# Patient Record
Sex: Male | Born: 1955 | Race: Black or African American | Hispanic: No | Marital: Married | Smoking: Never smoker
Health system: Southern US, Community
[De-identification: ages and names within clinical notes are randomized; demographics above are authoritative.]

## PROBLEM LIST (undated history)

## (undated) DIAGNOSIS — E785 Hyperlipidemia, unspecified: Secondary | ICD-10-CM

## (undated) DIAGNOSIS — R011 Cardiac murmur, unspecified: Secondary | ICD-10-CM

## (undated) DIAGNOSIS — J45909 Unspecified asthma, uncomplicated: Secondary | ICD-10-CM

## (undated) DIAGNOSIS — I10 Essential (primary) hypertension: Secondary | ICD-10-CM

## (undated) DIAGNOSIS — N189 Chronic kidney disease, unspecified: Secondary | ICD-10-CM

## (undated) DIAGNOSIS — R0981 Nasal congestion: Secondary | ICD-10-CM

## (undated) DIAGNOSIS — K648 Other hemorrhoids: Secondary | ICD-10-CM

## (undated) DIAGNOSIS — M1990 Unspecified osteoarthritis, unspecified site: Secondary | ICD-10-CM

## (undated) DIAGNOSIS — T7840XA Allergy, unspecified, initial encounter: Secondary | ICD-10-CM

## (undated) HISTORY — DX: Nasal congestion: R09.81

## (undated) HISTORY — PX: HAND TENDON SURGERY: SHX663

## (undated) HISTORY — DX: Unspecified asthma, uncomplicated: J45.909

## (undated) HISTORY — DX: Allergy, unspecified, initial encounter: T78.40XA

## (undated) HISTORY — DX: Unspecified osteoarthritis, unspecified site: M19.90

## (undated) HISTORY — PX: ELBOW SURGERY: SHX618

## (undated) HISTORY — DX: Hyperlipidemia, unspecified: E78.5

## (undated) HISTORY — PX: POLYPECTOMY: SHX149

## (undated) HISTORY — DX: Essential (primary) hypertension: I10

## (undated) HISTORY — PX: COLONOSCOPY: SHX174

## (undated) HISTORY — DX: Cardiac murmur, unspecified: R01.1

## (undated) HISTORY — DX: Other hemorrhoids: K64.8

## (undated) HISTORY — DX: Chronic kidney disease, unspecified: N18.9

---

## 2005-05-04 ENCOUNTER — Ambulatory Visit: Payer: Self-pay | Admitting: Gastroenterology

## 2005-05-22 ENCOUNTER — Encounter (INDEPENDENT_AMBULATORY_CARE_PROVIDER_SITE_OTHER): Payer: Self-pay | Admitting: *Deleted

## 2005-05-22 ENCOUNTER — Ambulatory Visit: Payer: Self-pay | Admitting: Gastroenterology

## 2005-06-20 ENCOUNTER — Ambulatory Visit: Payer: Self-pay | Admitting: Gastroenterology

## 2007-01-20 ENCOUNTER — Ambulatory Visit (HOSPITAL_COMMUNITY): Admission: RE | Admit: 2007-01-20 | Discharge: 2007-01-20 | Payer: Self-pay | Admitting: Urology

## 2007-08-11 ENCOUNTER — Ambulatory Visit: Payer: Self-pay | Admitting: Gastroenterology

## 2007-08-11 DIAGNOSIS — R195 Other fecal abnormalities: Secondary | ICD-10-CM

## 2007-09-03 ENCOUNTER — Ambulatory Visit: Payer: Self-pay | Admitting: Gastroenterology

## 2007-09-12 ENCOUNTER — Ambulatory Visit: Payer: Self-pay | Admitting: Gastroenterology

## 2007-09-12 ENCOUNTER — Encounter: Payer: Self-pay | Admitting: Gastroenterology

## 2007-09-17 ENCOUNTER — Encounter: Payer: Self-pay | Admitting: Gastroenterology

## 2007-09-30 ENCOUNTER — Ambulatory Visit: Payer: Self-pay | Admitting: Gastroenterology

## 2011-11-30 ENCOUNTER — Ambulatory Visit: Payer: 59

## 2011-11-30 ENCOUNTER — Ambulatory Visit (INDEPENDENT_AMBULATORY_CARE_PROVIDER_SITE_OTHER): Payer: 59 | Admitting: Family Medicine

## 2011-11-30 ENCOUNTER — Encounter: Payer: Self-pay | Admitting: Family Medicine

## 2011-11-30 VITALS — BP 135/87 | HR 78 | Temp 98.0°F | Resp 18 | Ht 66.0 in | Wt 188.0 lb

## 2011-11-30 DIAGNOSIS — Z23 Encounter for immunization: Secondary | ICD-10-CM

## 2011-11-30 DIAGNOSIS — Z0000 Encounter for general adult medical examination without abnormal findings: Secondary | ICD-10-CM

## 2011-11-30 DIAGNOSIS — M542 Cervicalgia: Secondary | ICD-10-CM

## 2011-11-30 DIAGNOSIS — E785 Hyperlipidemia, unspecified: Secondary | ICD-10-CM

## 2011-11-30 DIAGNOSIS — R945 Abnormal results of liver function studies: Secondary | ICD-10-CM

## 2011-11-30 DIAGNOSIS — Z8042 Family history of malignant neoplasm of prostate: Secondary | ICD-10-CM

## 2011-11-30 DIAGNOSIS — R7989 Other specified abnormal findings of blood chemistry: Secondary | ICD-10-CM

## 2011-11-30 LAB — POCT URINALYSIS DIPSTICK
Leukocytes, UA: NEGATIVE
Protein, UA: NEGATIVE
Spec Grav, UA: 1.02
Urobilinogen, UA: 0.2

## 2011-11-30 LAB — COMPREHENSIVE METABOLIC PANEL
ALT: 45 U/L (ref 0–53)
CO2: 28 mEq/L (ref 19–32)
Creat: 1.02 mg/dL (ref 0.50–1.35)
Total Bilirubin: 0.8 mg/dL (ref 0.3–1.2)

## 2011-11-30 LAB — IFOBT (OCCULT BLOOD): IFOBT: NEGATIVE

## 2011-11-30 LAB — LIPID PANEL
Cholesterol: 182 mg/dL (ref 0–200)
HDL: 52 mg/dL (ref >39)
Total CHOL/HDL Ratio: 3.5 Ratio
VLDL: 36 mg/dL (ref 0–40)

## 2011-11-30 MED ORDER — SIMVASTATIN 20 MG PO TABS: 20.0000 mg | ORAL_TABLET | Freq: Every evening | ORAL | Status: DC

## 2011-11-30 NOTE — Progress Notes (Signed)
Subjective:    Patient ID: Cory Baker, male    DOB: [birth year], 56 y.o.   MRN: [id number]  HPI   This 56 y.o. AA male is here for CPE; he takes Simvastatin 20 mg for lipid disorder. He reports  no side effects with this medication. He does have some mild neck discomfort with "popping and   cracking in back of neck" with some head movements (no radicular symptoms).   HE is married and works as a Biochemist, clinical; he stays physically active with strength training  twice a week. He denies any recent injury.   HCM: CRS- 2010 (normal)    Review of Systems  HENT: Positive for neck pain and sinus pressure.   All other systems reviewed and are negative.       Objective:   Physical Exam  Nursing note and vitals reviewed. Constitutional: He is oriented to person, place, and time. He appears well-developed and well-nourished. No distress.  HENT:  Head: Normocephalic and atraumatic.  Right Ear: Hearing, tympanic membrane, external ear and ear canal normal.  Left Ear: Hearing, tympanic membrane, external ear and ear canal normal.  Nose: No mucosal edema, rhinorrhea, nasal deformity or septal deviation. Right sinus exhibits no maxillary sinus tenderness and no frontal sinus tenderness. Left sinus exhibits no maxillary sinus tenderness and no frontal sinus tenderness.  Mouth/Throat: Uvula is midline, oropharynx is clear and moist and mucous membranes are normal. No oral lesions. Normal dentition. No dental caries.  Eyes: Conjunctivae normal, EOM and lids are normal. Pupils are equal, round, and reactive to light. No scleral icterus.  Fundoscopic exam:      The right eye shows no arteriolar narrowing, no AV nicking and no papilledema. The right eye shows red reflex.      The left eye shows no arteriolar narrowing, no AV nicking and no papilledema. The left eye shows red reflex. Neck: Normal range of motion. Neck supple. No thyromegaly present.       Posterior neck area w/o point  tenderness; ROM is normal- rotation, flexion and extension.  Cardiovascular: Normal rate, regular rhythm, normal heart sounds and intact distal pulses.  Exam reveals no gallop and no friction rub.   No murmur heard. Pulmonary/Chest: Effort normal and breath sounds normal. No respiratory distress.  Abdominal: Soft. Bowel sounds are normal. He exhibits no distension, no abdominal bruit, no pulsatile midline mass and no mass. There is no hepatosplenomegaly. There is no tenderness. There is no guarding and no CVA tenderness. No hernia. Hernia confirmed negative in the right inguinal area and confirmed negative in the left inguinal area.  Genitourinary: Rectum normal, prostate normal, testes normal and penis normal. Rectal exam shows no external hemorrhoid, no fissure, no mass, no tenderness and anal tone normal. Guaiac negative stool. Prostate is not tender. Cremasteric reflex is present. Right testis shows no mass, no swelling and no tenderness. Left testis shows no mass, no swelling and no tenderness.  Musculoskeletal: Normal range of motion. He exhibits no edema and no tenderness.  Lymphadenopathy:    He has no cervical adenopathy.       Right: No inguinal adenopathy present.       Left: No inguinal adenopathy present.  Neurological: He is alert and oriented to person, place, and time. He has normal reflexes. No cranial nerve deficit. He exhibits normal muscle tone. Coordination normal.  Skin: Skin is warm.  Psychiatric: He has a normal mood and affect. His behavior is normal. Judgment and thought content normal.  [facility] reading (PRIMARY) by  Dr. Audria Nine: C- spine- mild deg changes at C5-6 and C6-7. No fracture or other deformity.       Assessment & Plan:   1. Routine general medical examination at a health care facility  POCT urinalysis dipstick, IFOBT POC (occult bld, rslt in office)  2. Hyperlipidemia LDL goal < 100  Lipid panel  3. Abnormal LFTs - Nov 2012: AST=44, ALT=64  Comprehensive metabolic panel  4. Family hx of prostate cancer - father died at 61 PSA  5. Neck pain  OTC NSAIDs and topical analgesic/ moist heat NECK HEALTH pamphlet given  6. Need for prophylactic vaccination with combined diphtheria-tetanus-pertussis (DTP) vaccine  Tdap vaccine greater than or equal to 7yo IM

## 2011-11-30 NOTE — Patient Instructions (Addendum)
Keeping you healthy  Get these tests  Blood pressure- Have your blood pressure checked once a year by your healthcare provider.  Normal blood pressure is 120/80  Weight- Have your body mass index (BMI) calculated to screen for obesity.  BMI is a measure of body fat based on height and weight. You can also calculate your own BMI at [email or web address].  Cholesterol- Have your cholesterol checked every year.  Diabetes- Have your blood sugar checked regularly if you have high blood pressure, high cholesterol, have a family history of diabetes or if you are overweight.  Screening for Colon Cancer- Colonoscopy starting at age 11.  Screening may begin sooner depending on your family history and other health conditions. Follow up colonoscopy as directed by your Gastroenterologist.  Screening for Prostate Cancer- Both blood work (PSA) and a rectal exam help screen for Prostate Cancer.  Screening begins at age 36 with African-American men and at age 65 with Caucasian men.  Screening may begin sooner depending on your family history.  Take these medicines  Aspirin- One aspirin daily can help prevent Heart disease and Stroke.  Flu shot- Every fall.  Tetanus- Every 10 years. You had Tdap today; next Tetanus due in 2023.  Zostavax- Once after the age of 54 to prevent Shingles.  Pneumonia shot- Once after the age of 38; if you are younger than 70, ask your healthcare provider if you need a Pneumonia shot.  Take these steps  Don't smoke- If you do smoke, talk to your doctor about quitting.  For tips on how to quit, go to www.smokefree.gov or call 1-800-QUIT-NOW.  Be physically active- Exercise 5 days a week for at least 30 minutes.  If you are not already physically active start slow and gradually work up to 30 minutes of moderate physical activity.  Examples of moderate activity include walking briskly, mowing the yard, dancing, swimming, bicycling, etc.  Eat a healthy diet- Eat a variety of  healthy food such as fruits, vegetables, low fat milk, low fat cheese, yogurt, lean meant, poultry, fish, beans, tofu, etc. For more information go to www.thenutritionsource.org  Drink alcohol in moderation- Limit alcohol intake to less than two drinks a day. Never drink and drive.  Dentist- Brush and floss twice daily; visit your dentist twice a year.  Depression- Your emotional health is as important as your physical health. If you're feeling down, or losing interest in things you would normally enjoy please talk to your healthcare provider.  Eye exam- Visit your eye doctor every year.  Safe sex- If you may be exposed to a sexually transmitted infection, use a condom.  Seat belts- Seat belts can save your life; always wear one.  Smoke/Carbon Monoxide detectors- These detectors need to be installed on the appropriate level of your home.  Replace batteries at least once a year.  Skin cancer- When out in the sun, cover up and use sunscreen 15 SPF or higher.  Violence- If anyone is threatening you, please tell your healthcare provider.  Living Will/ Health care power of attorney- Speak with your healthcare provider and family.    Testicular Problems and Self-Exam Men can examine themselves easily and effectively with positive results. Monthly exams detect problems early and save lives. There are numerous causes of swelling in the testicle. Testicular cancer usually appears as a firm painless lump in the front part of the testicle. This may feel like a dull ache or heavy feeling located in the lower abdomen (belly), groin, or scrotum.  The risk is greater in men with undescended testicles and it is more common in young men. It is responsible for almost a fifth of cancers in males between ages 46 and 64. Other common causes of swellings, lumps, and testicular pain include injuries, inflammation (soreness) from infection, hydrocele, and torsion. These are a few of the reasons to do monthly  self-examination of the testicles. The exam only takes minutes and could add years to your life. Get in the habit! SELF-EXAMINATION OF THE TESTICLES The testicles are easiest to examine after warm baths or showers and are more difficult to examine when you are cold. This is because the muscles attached to the testicles retract and pull them up higher or into the abdomen. While standing, roll one testicle between the thumb and forefinger. Feel for lumps, swelling, or discomfort. A normal testicle is egg shaped and feels firm. It is smooth and not tender. The spermatic cord can be felt as a firm spaghetti-like cord at the back of the testicle. It is also important to examine your groins. This is the crease between the front of your leg and your abdomen. Also, feel for enlarged lymph nodes (glands). Enlarged nodes are also a cause for you to see your caregiver for evaluation.  Self-examination of the testicles and groin areas on a regular basis will help you to know what your own testicles and groins feel like. This will help you pick up an abnormality (difference) at an earlier stage. Early discovery is the key to curing this cancer or treating other conditions. Any lump, change, or swelling in the testicle calls for immediate evaluation by your caregiver. Cancer of the testicle does not result in impotence and it does not prevent normal intercourse or prevent having children. If your caregiver feels that medical treatment or chemotherapy could lead to infertility, sperm can be frozen for future use. It is necessary to see a caregiver as soon as possible after the discovery of a lump in a testicle. Document Released: 04/09/2000 Document Revised: 03/26/2011 Document Reviewed: 01/03/2008 [facility] Patient Information 2013 [place], [place].    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 You can take Aleve 1-2 tablets twice a day with food or snack for the pain. Moist heat can be helpful. Please read the manual about neck  problems.

## 2011-12-03 ENCOUNTER — Encounter: Payer: Self-pay | Admitting: Physician Assistant

## 2011-12-04 DIAGNOSIS — M5030 Other cervical disc degeneration, unspecified cervical region: Secondary | ICD-10-CM | POA: Insufficient documentation

## 2011-12-04 DIAGNOSIS — E663 Overweight: Secondary | ICD-10-CM | POA: Insufficient documentation

## 2011-12-04 DIAGNOSIS — Z8042 Family history of malignant neoplasm of prostate: Secondary | ICD-10-CM | POA: Insufficient documentation

## 2011-12-04 DIAGNOSIS — E785 Hyperlipidemia, unspecified: Secondary | ICD-10-CM | POA: Insufficient documentation

## 2011-12-04 NOTE — Progress Notes (Signed)
Quick Note:  Please call pt and advise that the following labs are abnormal... Chemistry profile is normal. Cholesterol profile is good; triglycerides ar a little above normal..Try to eat healthier- reduce "junk food", fried foods and processed foods (chips, cookies, pastries, etc) in diet.  Based on the lipid panel numbers, your overall risk of heart disease is 1/2 the average for men; this is great!  Copy to pt.  ______

## 2012-03-13 ENCOUNTER — Ambulatory Visit (INDEPENDENT_AMBULATORY_CARE_PROVIDER_SITE_OTHER): Payer: 59 | Admitting: Family Medicine

## 2012-03-13 VITALS — BP 155/82 | HR 82 | Temp 97.6°F | Resp 14 | Ht 65.5 in | Wt 190.0 lb

## 2012-03-13 DIAGNOSIS — R0981 Nasal congestion: Secondary | ICD-10-CM

## 2012-03-13 DIAGNOSIS — R42 Dizziness and giddiness: Secondary | ICD-10-CM

## 2012-03-13 DIAGNOSIS — J3489 Other specified disorders of nose and nasal sinuses: Secondary | ICD-10-CM

## 2012-03-13 DIAGNOSIS — R350 Frequency of micturition: Secondary | ICD-10-CM

## 2012-03-13 LAB — COMPREHENSIVE METABOLIC PANEL WITH GFR
ALT: 32 U/L (ref 0–53)
Alkaline Phosphatase: 81 U/L (ref 39–117)
CO2: 29 meq/L (ref 19–32)
Creat: 1 mg/dL (ref 0.50–1.35)
Total Bilirubin: 0.7 mg/dL (ref 0.3–1.2)

## 2012-03-13 LAB — POCT URINALYSIS DIPSTICK
Bilirubin, UA: NEGATIVE
Blood, UA: NEGATIVE
Glucose, UA: NEGATIVE
Ketones, UA: NEGATIVE
Leukocytes, UA: NEGATIVE
Nitrite, UA: NEGATIVE
Protein, UA: NEGATIVE
Spec Grav, UA: 1.01
Urobilinogen, UA: 0.2
pH, UA: 7

## 2012-03-13 LAB — COMPREHENSIVE METABOLIC PANEL
AST: 27 U/L (ref 0–37)
Albumin: 5 g/dL (ref 3.5–5.2)
BUN: 13 mg/dL (ref 6–23)
Calcium: 10.2 mg/dL (ref 8.4–10.5)
Chloride: 102 mEq/L (ref 96–112)
Glucose, Bld: 94 mg/dL (ref 70–99)
Potassium: 4.5 mEq/L (ref 3.5–5.3)
Sodium: 141 mEq/L (ref 135–145)
Total Protein: 8 g/dL (ref 6.0–8.3)

## 2012-03-13 LAB — POCT CBC
Granulocyte percent: 64.4 %G (ref 37–80)
HCT, POC: 47.5 % (ref 43.5–53.7)
Hemoglobin: 15.2 g/dL (ref 14.1–18.1)
Lymph, poc: 2.6 (ref 0.6–3.4)
MCH, POC: 28.2 pg (ref 27–31.2)
MCHC: 32 g/dL (ref 31.8–35.4)
MCV: 88.1 fL (ref 80–97)
MID (cbc): 0.4 (ref 0–0.9)
MPV: 9.5 fL (ref 0–99.8)
POC Granulocyte: 5.5 (ref 2–6.9)
POC LYMPH PERCENT: 30.6 % (ref 10–50)
POC MID %: 5 %M (ref 0–12)
Platelet Count, POC: 267 10*3/uL (ref 142–424)
RBC: 5.39 M/uL (ref 4.69–6.13)
RDW, POC: 13.4 %
WBC: 8.6 10*3/uL (ref 4.6–10.2)

## 2012-03-13 LAB — POCT UA - MICROSCOPIC ONLY
Bacteria, U Microscopic: NEGATIVE
Casts, Ur, LPF, POC: NEGATIVE
Crystals, Ur, HPF, POC: NEGATIVE
Epithelial cells, urine per micros: NEGATIVE
Mucus, UA: NEGATIVE
RBC, urine, microscopic: NEGATIVE
WBC, Ur, HPF, POC: NEGATIVE
Yeast, UA: NEGATIVE

## 2012-03-13 MED ORDER — CIPROFLOXACIN HCL 250 MG PO TABS: 250.0000 mg | ORAL_TABLET | Freq: Two times a day (BID) | ORAL | Status: DC

## 2012-03-13 NOTE — Patient Instructions (Addendum)
Dizziness  Dizziness is a common problem. It is a feeling of unsteadiness or lightheadedness. You may feel like you are about to faint. Dizziness can lead to injury if you stumble or fall. A person of any age group can suffer from dizziness, but dizziness is more common in older adults.  CAUSES    Dizziness can be caused by many different things, including:   Middle ear problems.   Standing for too long.   Infections.   An allergic reaction.   Aging.   An emotional response to something, such as the sight of blood.   Side effects of medicines.   Fatigue.   Problems with circulation or blood pressure.   Excess use of alcohol, medicines, or illegal drug use.   Breathing too fast (hyperventilation).   An arrhythmia or problems with your heart rhythm.   Low red blood cell count (anemia).   Pregnancy.   Vomiting, diarrhea, fever, or other illnesses that cause dehydration.   Diseases or conditions such as Parkinson's disease, high blood pressure (hypertension), diabetes, and thyroid problems.   Exposure to extreme heat.  DIAGNOSIS    To find the cause of your dizziness, your caregiver may do a physical exam, lab tests, radiologic imaging scans, or an electrocardiography test (ECG).    TREATMENT    Treatment of dizziness depends on the cause of your symptoms and can vary greatly.  HOME CARE INSTRUCTIONS     Drink enough fluids to keep your urine clear or pale yellow. This is especially important in very hot weather. In the elderly, it is also important in cold weather.   If your dizziness is caused by medicines, take them exactly as directed. When taking blood pressure medicines, it is especially important to get up slowly.   Rise slowly from chairs and steady yourself until you feel okay.   In the morning, first sit up on the side of the bed. When this seems okay, stand slowly while holding onto something until you know your balance is fine.    If you need to stand in one place for a long time, be sure to move your legs often. Tighten and relax the muscles in your legs while standing.   If dizziness continues to be a problem, have someone stay with you for a day or two. Do this until you feel you are well enough to stay alone. Have the person call your caregiver if he or she notices changes in you that are concerning.   Do not drive or use heavy machinery if you feel dizzy.   Do not drink alcohol.  SEEK IMMEDIATE MEDICAL CARE IF:     Your dizziness or lightheadedness gets worse.   You feel nauseous or vomit.   You develop problems with talking, walking, weakness, or using your arms, hands, or legs.   You are not thinking clearly or you have difficulty forming sentences. It may take a friend or family member to determine if your thinking is normal.   You develop chest pain, abdominal pain, shortness of breath, or sweating.   Your vision changes.   You notice any bleeding.   You have side effects from medicine that seems to be getting worse rather than better.  MAKE SURE YOU:     Understand these instructions.   Will watch your condition.   Will get help right away if you are not doing well or get worse.  Document Released: 06/27/2000 Document Revised: 03/26/2011 Document Reviewed: 07/21/2010  [employer]Care

## 2012-03-13 NOTE — Progress Notes (Signed)
[facility]:  Office Visit  Chief Complaint:  Chief Complaint  Patient presents with  . Dizziness    1 week- BP elevated    HPI: Cory Baker is a 57 y.o. male who complains of  1 week history of intermittent dizziness with head movement, felt "oozy like", was at work today and walking around felt dizzy so went to employee health. Nurse took his BP 183/109.  When he would dizzy at [facility] he had BP 117/76. No h/o elevated BP. Get up in the mornings, and when he would get up and lay down. The room only spinned when he layed on his side. It started after he used drops in his ears . Sinus drains a lot during winter. Has been eating and drinking normally. Has had increased urinary frequency. Denies HA, vision changes, n/v/abd pain, CP, palpitations. .  Past Medical History  Diagnosis Date  . Heart murmur   . Asthma    Past Surgical History  Procedure Laterality Date  . Hand tendon surgery     History   Social History  . Marital Status: Married    Spouse Name: N/A    Number of Children: N/A  . Years of Education: N/A   Social History Main Topics  . Smoking status: Never Smoker   . Smokeless tobacco: Never Used  . Alcohol Use: No  . Drug Use: No  . Sexually Active: Yes   Other Topics Concern  . None   Social History Narrative  . None   Family History  Problem Relation Age of Onset  . Kidney disease Mother   . Cancer Father   . Cancer Sister   . Heart disease Brother    No Known Allergies Prior to Admission medications   Medication Sig Start Date End Date Taking? Authorizing Provider  simvastatin (ZOCOR) 20 MG tablet Take 1 tablet (20 mg total) by mouth every evening. 11/30/11  Yes Maurice March, MD     ROS: The patient denies fevers, chills, night sweats, unintentional weight loss, chest pain, palpitations, wheezing, dyspnea on exertion, nausea, vomiting, abdominal pain, dysuria, hematuria, melena, numbness, weakness, or tingling.    All other systems have been reviewed and were otherwise negative with the exception of those mentioned in the HPI and as above.    PHYSICAL EXAM: Filed Vitals:   03/13/12 1215  BP: 155/82  Pulse: 82  Temp: 97.6 F (36.4 C)  Resp: 14   Filed Vitals:   03/13/12 1215  Height: 5' 5.5" (1.664 m)  Weight: 190 lb (86.183 kg)   Body mass index is 31.13 kg/(m^2).  General: Alert, no acute distress HEENT:  Normocephalic, atraumatic, oropharynx patent. TM nl, EOMI, PERRLA, no exudates, nontender sinuses Cardiovascular:  Regular rate and rhythm, no rubs murmurs or gallops.  No Carotid bruits, radial pulse intact. No pedal edema.  Respiratory: Clear to auscultation bilaterally.  No wheezes, rales, or rhonchi.  No cyanosis, no use of accessory musculature GI: No organomegaly, abdomen is soft and non-tender, positive bowel sounds.  No masses. Skin: No rashes. Neurologic: Facial musculature symmetric. Psychiatric: Patient is appropriate throughout our interaction. Lymphatic: No cervical lymphadenopathy Musculoskeletal: Gait intact.   LABS: Results for orders placed in visit on 03/13/12  COMPREHENSIVE METABOLIC PANEL      Result Value Range   Sodium 141  135 - 145 mEq/L   Potassium 4.5  3.5 - 5.3 mEq/L   Chloride 102  96 - 112 mEq/L   CO2 29  19 - 32 mEq/L   Glucose, Bld 94  70 - 99 mg/dL   BUN 13  6 - 23 mg/dL   Creat 8.11  9.14 - 7.82 mg/dL   Total Bilirubin 0.7  0.3 - 1.2 mg/dL   Alkaline Phosphatase 81  39 - 117 U/L   AST 27  0 - 37 U/L   ALT 32  0 - 53 U/L   Total Protein 8.0  6.0 - 8.3 g/dL   Albumin 5.0  3.5 - 5.2 g/dL   Calcium 95.6  8.4 - 21.3 mg/dL  POCT CBC      Result Value Range   WBC 8.6  4.6 - 10.2 K/uL   Lymph, poc 2.6  0.6 - 3.4   POC LYMPH PERCENT 30.6  10 - 50 %L   MID (cbc) 0.4  0 - 0.9   POC MID % 5.0  0 - 12 %M   POC Granulocyte 5.5  2 - 6.9   Granulocyte percent 64.4  37 - 80 %G   RBC 5.39  4.69 - 6.13 M/uL   Hemoglobin 15.2  14.1 - 18.1 g/dL    HCT, POC 08.6  57.8 - 53.7 %   MCV 88.1  80 - 97 fL   MCH, POC 28.2  27 - 31.2 pg   MCHC 32.0  31.8 - 35.4 g/dL   RDW, POC 46.9     Platelet Count, POC 267  142 - 424 K/uL   MPV 9.5  0 - 99.8 fL  POCT UA - MICROSCOPIC ONLY      Result Value Range   WBC, Ur, HPF, POC neg     RBC, urine, microscopic neg     Bacteria, U Microscopic neg     Mucus, UA neg     Epithelial cells, urine per micros neg     Crystals, Ur, HPF, POC neg     Casts, Ur, LPF, POC neg     Yeast, UA neg    POCT URINALYSIS DIPSTICK      Result Value Range   Color, UA yellow     Clarity, UA clear     Glucose, UA neg     Bilirubin, UA neg     Ketones, UA neg     Spec Grav, UA 1.010     Blood, UA neg     pH, UA 7.0     Protein, UA neg     Urobilinogen, UA 0.2     Nitrite, UA neg     Leukocytes, UA Negative       EKG/XRAY:   Primary read interpreted by [facility] at [facility].   ASSESSMENT/PLAN: Encounter Diagnoses  Name Primary?  . Dizziness and giddiness Yes  . Increased frequency of urination   . Sinus congestion    Rx cipro 250 mg BID x 3 days Monitor sxs. Take BP daily and then call me back.  He is not orthostatic, I am thinking this is sinus or inner ear. Urine culture Work note given   LE, THAO PHUONG, DO 03/14/2012 10:08 AM

## 2012-03-15 LAB — URINE CULTURE
Colony Count: NO GROWTH
Organism ID, Bacteria: NO GROWTH

## 2012-03-24 ENCOUNTER — Telehealth: Payer: Self-pay | Admitting: Family Medicine

## 2012-03-24 NOTE — Telephone Encounter (Signed)
LM regarding labs and also to see how he is doing with the dizziness.

## 2012-07-14 ENCOUNTER — Encounter: Payer: Self-pay | Admitting: Physician Assistant

## 2012-09-02 ENCOUNTER — Telehealth: Payer: Self-pay

## 2012-09-02 NOTE — Telephone Encounter (Signed)
Pt had his CPE in November 2013 with Alycia Rossetti, has scheduled his annual for this November. He is out of refills on his simvastatin and though he would have been given enough to last the year?  [facility] [address]  Pt [phone]

## 2012-09-03 MED ORDER — SIMVASTATIN 20 MG PO TABS: 20.0000 mg | ORAL_TABLET | Freq: Every evening | ORAL | Status: DC

## 2012-09-03 NOTE — Telephone Encounter (Signed)
I have never seen this patient. Please forward to treating provider.

## 2012-09-03 NOTE — Telephone Encounter (Signed)
Dr Audria Nine saw pt for  CPE 11/30/2011. Sent in one month supply for Simvastatin. Advised pt we needed him to come in for an office visit. Pt will call for appt.

## 2012-10-01 ENCOUNTER — Other Ambulatory Visit: Payer: Self-pay | Admitting: Family Medicine

## 2012-12-08 ENCOUNTER — Encounter: Payer: Self-pay | Admitting: Physician Assistant

## 2012-12-08 ENCOUNTER — Ambulatory Visit (INDEPENDENT_AMBULATORY_CARE_PROVIDER_SITE_OTHER): Payer: 59 | Admitting: Physician Assistant

## 2012-12-08 VITALS — BP 148/106 | HR 82 | Temp 97.7°F | Resp 16 | Ht 65.5 in | Wt 196.5 lb

## 2012-12-08 DIAGNOSIS — H6590 Unspecified nonsuppurative otitis media, unspecified ear: Secondary | ICD-10-CM

## 2012-12-08 DIAGNOSIS — Z0000 Encounter for general adult medical examination without abnormal findings: Secondary | ICD-10-CM

## 2012-12-08 LAB — IFOBT (OCCULT BLOOD): IFOBT: NEGATIVE

## 2012-12-08 LAB — COMPREHENSIVE METABOLIC PANEL
ALT: 49 U/L (ref 0–53)
AST: 36 U/L (ref 0–37)
Albumin: 4.8 g/dL (ref 3.5–5.2)
Alkaline Phosphatase: 71 U/L (ref 39–117)
Glucose, Bld: 86 mg/dL (ref 70–99)
Potassium: 3.8 mEq/L (ref 3.5–5.3)
Sodium: 138 mEq/L (ref 135–145)
Total Bilirubin: 0.8 mg/dL (ref 0.3–1.2)
Total Protein: 7.6 g/dL (ref 6.0–8.3)

## 2012-12-08 LAB — LIPID PANEL
HDL: 52 mg/dL (ref >39)
LDL Cholesterol: 81 mg/dL (ref 0–99)
Total CHOL/HDL Ratio: 3.2 Ratio
VLDL: 34 mg/dL (ref 0–40)

## 2012-12-08 LAB — POCT URINALYSIS DIPSTICK
Bilirubin, UA: NEGATIVE
Blood, UA: NEGATIVE
Leukocytes, UA: NEGATIVE
Nitrite, UA: NEGATIVE
Urobilinogen, UA: 0.2
pH, UA: 5.5

## 2012-12-08 LAB — CBC
Hemoglobin: 14.6 g/dL (ref 13.0–17.0)
MCH: 29 pg (ref 26.0–34.0)
MCHC: 35 g/dL (ref 30.0–36.0)
Platelets: 217 10*3/uL (ref 150–400)
RDW: 13.5 % (ref 11.5–15.5)

## 2012-12-08 LAB — POCT UA - MICROSCOPIC ONLY
Casts, Ur, LPF, POC: NEGATIVE
WBC, Ur, HPF, POC: NEGATIVE
Yeast, UA: NEGATIVE

## 2012-12-08 MED ORDER — IPRATROPIUM BROMIDE 0.06 % NA SOLN: 2.0000 | Freq: Three times a day (TID) | NASAL | Status: DC

## 2012-12-08 MED ORDER — ZOSTER VACCINE LIVE 19400 UNT/0.65ML ~~LOC~~ SOLR: 0.6500 mL | Freq: Once | SUBCUTANEOUS | Status: DC

## 2012-12-08 MED ORDER — FLUTICASONE PROPIONATE 50 MCG/ACT NA SUSP: 2.0000 | Freq: Every day | NASAL | Status: DC

## 2012-12-08 NOTE — Progress Notes (Signed)
Patient ID: Cory Baker MRN: [id number], DOB: 12/10/1955 57 y.o. Date of Encounter: 12/08/2012, 6:54 PM  Primary Physician: No primary provider on file.  Chief Complaint: Physical (CPE)  HPI: 57 y.o. male with history noted below here for CPE. Doing well. Last physical was 2013.   1) Hyperlipidemia: Takes simvastatin 20 mg qhs. No adverse effects. Needs refills. Tries to stay active and eat healthy. Has an active job at the [employer].   2) Sinus congestion: This is a longstanding issues for him. Has always dealt with nasal congestion and sinus pressure. Was seen on 03/13/12 for vertigo-like symptoms and diagnosed with sinus congestion. Treated with cipro 250 mg bid x 3 days. He states his symptoms improved some, but did not fully resolve. He notes that his ears have been feeling quite full lately. He does not feel that dizziness feeling or like the room is spinning again. He does note however that since all of this began is February his BP has been up and down. Sometimes it is elevated, and sometimes it is normal. He notes it is normal in the morning and in the evening. It seems to be elevated after getting out of his truck, which he says is leaking fumes. He plans on getting this fixed.   3) CPE: Has already received his influenza vaccine. He is due for his follow up colonoscopy this year with [facility] and Dr. Arlyce Dice.    Review of Systems: Consitutional: No fever, chills, fatigue, night sweats, lymphadenopathy, or weight changes. Eyes: No visual changes, eye redness, or discharge. ENT/Mouth: Ears: No otalgia, tinnitus, hearing loss, discharge. Nose: Positive for sinus pain. No congestion, rhinorrhea, or epistaxis. Throat: No sore throat, post nasal drip, or teeth pain. Cardiovascular: No CP, palpitations, diaphoresis, DOE, edema, orthopnea, PND. Respiratory: No cough, hemoptysis, SOB, or wheezing. Gastrointestinal: No anorexia, dysphagia, reflux, pain, nausea, vomiting,  hematemesis, diarrhea, constipation, BRBPR, or melena. Genitourinary: No dysuria, frequency, urgency, hematuria, incontinence, nocturia, decreased urinary stream, discharge, impotence, or testicular pain/masses. Musculoskeletal: No decreased ROM, myalgias, stiffness, joint swelling, or weakness. Skin: No rash, erythema, lesion changes, pain, warmth, jaundice, or pruritis. Neurological: No headache, dizziness, syncope, seizures, tremors, memory loss, coordination problems, or paresthesias. Psychological: No anxiety, depression, hallucinations, SI/HI. Endocrine: No fatigue, polydipsia, polyphagia, polyuria, or known diabetes.   Past Medical History  Diagnosis Date  . Heart murmur   . Asthma   . Hyperlipidemia   . Sinus congestion      Past Surgical History  Procedure Laterality Date  . Hand tendon surgery      Home Meds:  Prior to Admission medications   Medication Sig Start Date End Date Taking? Authorizing Provider  simvastatin (ZOCOR) 20 MG tablet TAKE 1 TABLET (20 MG TOTAL) BY MOUTH EVERY EVENING. 10/01/12  Yes Godfrey Pick, PA-C                         Allergies: No Known Allergies  History   Social History  . Marital Status: Married    Spouse Name: N/A    Number of Children: N/A  . 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 Smoking status: Never Smoker   . Smokeless tobacco: Never Used  . Alcohol Use: No  . Drug Use: No  . Sexual Activity: Yes   Other Topics Concern  . Not on file   Social History Narrative  Married.     Family History  Problem Relation Age of Onset  . Kidney disease Mother   . Cancer Mother   . Cancer Father   . Hypertension Father   . Cancer Sister   . Heart disease Brother   . Diabetes Brother     Physical Exam: Blood pressure 148/106, pulse 82, temperature 97.7 F (36.5 C), temperature source Oral, resp. rate 16, height 5' 5.5" (1.664 m), weight  196 lb 8 oz (89.132 kg), SpO2 97.00%.  General: Well developed, well nourished, in no acute distress. HEENT: Normocephalic, atraumatic. Conjunctiva pink, sclera non-icteric. Pupils 2 mm constricting to 1 mm, round, regular, and equally reactive to light and accomodation. EOMI. Internal auditory canal clear. TMs with serous effusion bilaterally. Nasal mucosa pink. Nares are without discharge. No sinus tenderness. Oral mucosa pink. Dentition normal. Pharynx without exudate.   Neck: Supple. Trachea midline. No thyromegaly. Full ROM. No lymphadenopathy. Lungs: Clear to auscultation bilaterally without wheezes, rales, or rhonchi. Breathing is of normal effort and unlabored. Cardiovascular: RRR with S1 S2. No murmurs, rubs, or gallops appreciated. Distal pulses 2+ symmetrically. No carotid or abdominal bruits. Abdomen: Soft, non-tender, non-distended with normoactive bowel sounds. No hepatosplenomegaly or masses. No rebound/guarding. No CVA tenderness. Without hernias.  Rectal: No external hemorrhoids or fissures. Rectal vault without masses. Prostate not enlarged, smooth, symmetrical, without nodules, or TTP.   Genitourinary: Circumcised male. No penile lesions. Testes descended bilaterally, and smooth without tenderness or masses.  Musculoskeletal: Full range of motion and 5/5 strength throughout. Without swelling, atrophy, tenderness, crepitus, or warmth. Extremities without clubbing, cyanosis, or edema. Calves supple. Skin: Warm and moist without erythema, ecchymosis, wounds, or rash. Neuro: A+Ox3. CN II-XII grossly intact. Moves all extremities spontaneously. Full sensation throughout. Normal gait. DTR 2+ throughout upper and lower extremities. Finger to nose intact. Psych:  Responds to questions appropriately with a normal affect.   Studies:  Results for orders placed in visit on 12/08/12  POCT UA - MICROSCOPIC ONLY      Result Value Range   WBC, Ur, HPF, POC neg     RBC, urine, microscopic 0-1       Bacteria, U Microscopic neg     Mucus, UA neg     Epithelial cells, urine per micros 0-1     Crystals, Ur, HPF, POC neg     Casts, Ur, LPF, POC neg     Yeast, UA neg    POCT URINALYSIS DIPSTICK      Result Value Range   Color, UA yellow     Clarity, UA clear     Glucose, UA neg     Bilirubin, UA neg     Ketones, UA neg     Spec Grav, UA >=1.030     Blood, UA neg     pH, UA 5.5     Protein, UA neg     Urobilinogen, UA 0.2     Nitrite, UA neg     Leukocytes, UA Negative    IFOBT (OCCULT BLOOD)      Result Value Range   IFOBT Negative       CBC, CMET, Lipid, PSA, TSH all pending. Patient is not fasting.   Assessment/Plan:  57 y.o. male here for CPE bilateral serous otitis media and elevated BP reading  1) Bilateral serous otitis media -Trial of Flonase 2 sprays each nare daily #1 RF 6 -Trial of Atrovent NS 0.06% 2 sprays each nare bid prn #1 RF 6 -Saline nasal spray -  Get truck fixed so it is not leaking fumes -Recheck 2 months  2) Elevated BP readings -Discussed with patient in detail treatment options -Elected to proceed with treating the serous otitis media and his truck at this time and see how this affects his overall BP -Discussed that since he does have BP readings in the 120's/70's treating him with an antihypertensive or a diuretic may mimic the symptoms he had in February when he saw Dr. Conley Rolls thus further complicating the picture  -Recheck 2 months  3) CPE -Await labs -Healthy diet and exercise -Weight loss -Has already received his influenza vaccine -Due for follow up colonoscopy this year -Zostavax As directed #1 no RF -TDaP UTD -Age appropriate anticipatory guidance    Signed, Eula Listen, PA-C [facility] [place], [place] [address] [phone] 12/08/2012 6:54 PM

## 2012-12-08 NOTE — Progress Notes (Signed)
  Subjective:    Patient ID: Cory Baker, male    DOB: [birth year], 57 y.o.   MRN: [id number]  HPI    Review of Systems  Constitutional: Negative.   HENT: Positive for sinus pressure.   Eyes: Negative.   Respiratory: Negative.   Cardiovascular: Negative.   Gastrointestinal: Negative.   Endocrine: Negative.   Genitourinary: Negative.   Musculoskeletal: Positive for neck pain and neck stiffness.  Allergic/Immunologic: Negative.   Neurological: Negative.   Hematological: Negative.   Psychiatric/Behavioral: Negative.        Objective:   Physical Exam        Assessment & Plan:

## 2012-12-09 LAB — PSA: PSA: 0.67 ng/mL

## 2012-12-09 LAB — TSH: TSH: 1.934 u[IU]/mL (ref 0.350–4.500)

## 2012-12-10 ENCOUNTER — Other Ambulatory Visit: Payer: Self-pay

## 2012-12-10 MED ORDER — SIMVASTATIN 20 MG PO TABS: ORAL_TABLET | ORAL | Status: DC

## 2013-01-29 ENCOUNTER — Encounter: Payer: Self-pay | Admitting: Physician Assistant

## 2013-01-29 ENCOUNTER — Telehealth: Payer: Self-pay | Admitting: Physician Assistant

## 2013-01-29 NOTE — Telephone Encounter (Signed)
Please mail a letter to the patient. We received notification that [facility] has not been able to contact him to schedule his colonoscopy. At this point it is his responsibility to contact their office to schedule this. Below is their contact information.   [facility]:  Phone: [phone]  Christell Faith, PA-C

## 2013-01-29 NOTE — Telephone Encounter (Signed)
Letter sent.

## 2013-02-09 ENCOUNTER — Ambulatory Visit (INDEPENDENT_AMBULATORY_CARE_PROVIDER_SITE_OTHER): Payer: 59 | Admitting: Family Medicine

## 2013-02-09 ENCOUNTER — Encounter: Payer: Self-pay | Admitting: Physician Assistant

## 2013-02-09 VITALS — BP 160/84 | HR 82 | Temp 98.5°F | Resp 16 | Ht 66.0 in | Wt 199.0 lb

## 2013-02-09 DIAGNOSIS — R42 Dizziness and giddiness: Secondary | ICD-10-CM

## 2013-02-09 DIAGNOSIS — I10 Essential (primary) hypertension: Secondary | ICD-10-CM

## 2013-02-09 DIAGNOSIS — E785 Hyperlipidemia, unspecified: Secondary | ICD-10-CM

## 2013-02-09 MED ORDER — LISINOPRIL-HYDROCHLOROTHIAZIDE 10-12.5 MG PO TABS: 1.0000 | ORAL_TABLET | Freq: Every day | ORAL | Status: DC

## 2013-02-09 MED ORDER — SIMVASTATIN 20 MG PO TABS: ORAL_TABLET | ORAL | Status: DC

## 2013-02-09 NOTE — Progress Notes (Signed)
Subjective:    Patient ID: Cory Baker, male    DOB: 1955/11/17, 58 y.o.   MRN: [id number]  HPI Primary Physician: No primary provider on file.  Chief Complaint: Follow up of HTN and ears  HPI: 58 y.o. male with history below presents for follow up of HTN and ears.  1) Hypertension: Never on any antihypertensives. Blood pressure at his last OV on 12/08/12 was 148/106. He did not want to start antihypertensive at that time stating that his BP readings at home had been normal. He wished to continue to monitor this and follow up. No chest pain, headache, or vision change.  2) Serous otitis media: Still feels like there is fluid in his ear. Has felt this for the past year. This is mostly felt in the right ear, but will go back-and-forth between the right and left ear. Right now the right ear feel fine and the left ear is bothering him. His complaints are of increased pressure and when he swallows the ear will pop. There is an associated increased pressure behind his eyes. His nose dries up. There is no nasal congestion. The nasal spray helps to keep his nasal passages clear and he feels like this helps. He describes the pressure along the bridge of his nose. He does not have any pressure, pain, or headache along his sinuses. There are times when he does still feel light headed. This is associated with positional change from laying down, to sitting down, or laying to one side. On occasion he notes that his legs have not felt quite as strong, he describes this as "wirey."     Past Medical History  Diagnosis Date  . Heart murmur   . Asthma   . Hyperlipidemia   . Sinus congestion      Home Meds: Prior to Admission medications   Medication Sig Start Date End Date Taking? Authorizing Provider  fluticasone (FLONASE) 50 MCG/ACT nasal spray Place 2 sprays into both nostrils daily. 12/08/12   Areta Haber Aubreanna Percle, PA-C  ipratropium (ATROVENT) 0.06 % nasal spray Place 2 sprays into the nose 3 (three)  times daily. 12/08/12   Benjiman Sedgwick M Simrat Kendrick, PA-C  simvastatin (ZOCOR) 20 MG tablet TAKE 1 TABLET (20 MG TOTAL) BY MOUTH EVERY EVENING. 12/10/12   Rise Mu, PA-C  zoster vaccine live, PF, (ZOSTAVAX) 10258 UNT/0.65ML injection Inject 19,400 Units into the skin once. 12/08/12   Rise Mu, PA-C    Allergies: No Known Allergies  History   Social History  . Marital Status: Married    Spouse Name: N/A    Number of Children: N/A  . 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 Smokeless tobacco: Never Used  . Alcohol Use: No  . Drug Use: No  . Sexual Activity: Yes   Other Topics Concern  . Not on file   Social History Narrative   Married.       Review of Systems  HENT: Negative for congestion, ear discharge, ear pain, facial swelling, hearing loss, postnasal drip, rhinorrhea, sinus pressure and tinnitus.   Respiratory: Positive for shortness of breath. Negative for cough, chest tightness and wheezing.        SOB more in the morning.   Cardiovascular: Negative for chest pain, palpitations and leg swelling.  Neurological: Positive for dizziness, weakness and light-headedness. Negative for tremors, seizures, syncope, facial asymmetry,  speech difficulty, numbness and headaches.       Objective:   Physical Exam  Physical Exam: Blood pressure 160/84, pulse 82, temperature 98.5 F (36.9 C), resp. rate 16, height 5\' 6"  (1.676 m), weight 199 lb (90.266 kg), SpO2 98.00%., Body mass index is 32.13 kg/(m^2). General: Well developed, well nourished, in no acute distress. Head: Normocephalic, atraumatic, eyes without discharge, sclera non-icteric, nares are without discharge. Bilateral auditory canals with minimal cerumen along the basement, no impaction. TM's are without perforation, pearly grey and translucent with reflective cone of light bilaterally. Oral cavity moist, posterior pharynx  without exudate, erythema, peritonsillar abscess, or post nasal drip. Uvula midline.   Neck: Supple. No thyromegaly. Full ROM. No lymphadenopathy. Lungs: Clear bilaterally to auscultation without wheezes, rales, or rhonchi. Breathing is unlabored. Heart: RRR with S1 S2. No murmurs, rubs, or gallops appreciated. Msk:  Strength and tone normal for age. Extremities/Skin: Warm and dry. No clubbing or cyanosis. No edema. No rashes or suspicious lesions. Neuro: Alert and oriented X 3. Moves all extremities spontaneously. Gait is normal. CNII-XII grossly in tact. No nystagmus on exam.  Psych:  Responds to questions appropriately with a normal affect.    EKG: Nonspecific ST in V2-V3/ early repolarization. No acute findings.       Assessment & Plan:  58 year old male with hypertension and dizziness  1) Hypertension -Trial of lisinopril/HCTZ 10/12.5 1 po daily #30 RF 1 -Recheck 2-3 weeks -Close follow up -Precautions given   2) Dizziness  -This is quite possibly related to the above hypertension -It is not exertional -If his symptoms persist we will evaluate further with referral     Christell Faith, MHS, PA-C [facility] [place], [place] [address] [place] 02/09/2013 4:19 PM

## 2013-02-10 NOTE — Progress Notes (Signed)
EKG read and patient discussed with Ryan Dunn, PA-C. Agree with assessment and plan of care per his note.   

## 2013-03-05 ENCOUNTER — Ambulatory Visit (INDEPENDENT_AMBULATORY_CARE_PROVIDER_SITE_OTHER): Payer: 59 | Admitting: Physician Assistant

## 2013-03-05 VITALS — BP 126/86 | HR 81 | Temp 98.1°F | Resp 18 | Ht 66.0 in | Wt 199.0 lb

## 2013-03-05 DIAGNOSIS — E785 Hyperlipidemia, unspecified: Secondary | ICD-10-CM

## 2013-03-05 DIAGNOSIS — R42 Dizziness and giddiness: Secondary | ICD-10-CM

## 2013-03-05 DIAGNOSIS — I10 Essential (primary) hypertension: Secondary | ICD-10-CM

## 2013-03-05 MED ORDER — LISINOPRIL 10 MG PO TABS: 10.0000 mg | ORAL_TABLET | Freq: Every day | ORAL | Status: DC

## 2013-03-05 MED ORDER — SIMVASTATIN 20 MG PO TABS: ORAL_TABLET | ORAL | Status: DC

## 2013-03-05 NOTE — Progress Notes (Signed)
Subjective:    Patient ID: Cory Baker, male    DOB: 10-03-1955, 58 y.o.   MRN: [id number]  HPI Primary Physician: No primary provider on file.  Chief Complaint: Follow up hypertension   HPI: 58 y.o. male with history below presents for follow up hypertension. Patient recently started on lisinopril/HCTZ 10/12.5 mg daily on 02/09/13 after several visits of him wanting to monitor his BP. His BP has responded well. Tolerating the medication without issues.   He notes since starting this medication his episodes of dizziness have decreased in frequency. He does still have them. More so at night and in the morning when he wakes up. After he takes his blood pressure medication they go away. No other focal deficits.      Past Medical History  Diagnosis Date  . Heart murmur   . Asthma   . Hyperlipidemia   . Sinus congestion      Home Meds: Prior to Admission medications   Medication Sig Start Date End Date Taking? Authorizing Provider  fluticasone (FLONASE) 50 MCG/ACT nasal spray Place 2 sprays into both nostrils daily. 12/08/12  Yes Elizibeth Breau M Addeline Calarco, PA-C  ipratropium (ATROVENT) 0.06 % nasal spray Place 2 sprays into the nose 3 (three) times daily. 12/08/12  Yes Janelle Spellman M Levana Minetti, PA-C  lisinopril-hydrochlorothiazide (PRINZIDE,ZESTORETIC) 10-12.5 MG per tablet Take 1 tablet by mouth daily. 02/09/13  Yes Jameel Quant M Cipriana Biller, PA-C  simvastatin (ZOCOR) 20 MG tablet TAKE 1 TABLET (20 MG TOTAL) BY MOUTH EVERY EVENING. 02/09/13  Yes Jisell Majer M Avid Guillette, PA-C    Allergies: No Known Allergies  History   Social History  . Marital Status: Married    Spouse Name: N/A    Number of Children: N/A  . 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 Smokeless tobacco: Never Used  . Alcohol Use: No  . Drug Use: No  . Sexual Activity: Yes   Other Topics Concern  . Not on file   Social History Narrative   Married.      Review of Systems  Constitutional: Negative for fatigue.  Eyes: Negative for visual disturbance.  Cardiovascular: Negative for chest pain.  Neurological: Positive for dizziness and light-headedness. Negative for tremors, seizures, syncope, speech difficulty, weakness, numbness and headaches.       Objective:   Physical Exam  Physical Exam: Blood pressure 126/86, pulse 81, temperature 98.1 F (36.7 C), temperature source Oral, resp. rate 18, height 5\' 6"  (1.676 m), weight 199 lb (90.266 kg), SpO2 98.00%., Body mass index is 32.13 kg/(m^2). General: Well developed, well nourished, in no acute distress. Head: Normocephalic, atraumatic, eyes without discharge, sclera non-icteric, nares are without discharge. Bilateral auditory canals clear, TM's are without perforation, pearly grey and translucent with reflective cone of light bilaterally. Oral cavity moist, posterior pharynx without exudate, erythema, peritonsillar abscess, or post nasal drip. Uvula midline.   Neck: Supple. No thyromegaly. Full ROM. No lymphadenopathy. Lungs: Clear bilaterally to auscultation without wheezes, rales, or rhonchi. Breathing is unlabored. Heart: RRR with S1 S2. No murmurs, rubs, or gallops appreciated. Msk:  Strength and tone normal for age. Extremities/Skin: Warm and dry. No clubbing or cyanosis. No edema. No rashes or suspicious lesions. Neuro: Alert and oriented X 3. Moves all extremities spontaneously. Gait is normal. CNII-XII grossly in tact. Psych:  Responds to questions appropriately with a normal affect.  Assessment & Plan:  58 year old male hypertension  -Much improved -Patient's dizziness has improved with the improvement of his HTN as well. He still does have some dizziness in the evening and in the morning hours. It is reasonable to have him do a trial of an antihypertensive in the evening to see if this helps with this symptom.  -Add lisinopril 10 mg 1 po in the evening #30  RF 1 -Continue lisinopril/HCTZ 10/12.5 mg 1 po daily -Follow up 1 month   Christell Faith, MHS, PA-C [facility] [place], [place] [address] [place] 03/05/2013 8:57 PM

## 2013-04-03 ENCOUNTER — Other Ambulatory Visit: Payer: Self-pay | Admitting: Physician Assistant

## 2013-04-21 ENCOUNTER — Ambulatory Visit (INDEPENDENT_AMBULATORY_CARE_PROVIDER_SITE_OTHER): Payer: 59 | Admitting: Physician Assistant

## 2013-04-21 VITALS — BP 108/92 | HR 74 | Temp 97.9°F | Resp 16 | Ht 67.0 in | Wt 195.8 lb

## 2013-04-21 DIAGNOSIS — I10 Essential (primary) hypertension: Secondary | ICD-10-CM

## 2013-04-21 DIAGNOSIS — R42 Dizziness and giddiness: Secondary | ICD-10-CM

## 2013-04-21 NOTE — Progress Notes (Signed)
Subjective:    Patient ID: Cory Baker, male    DOB: 03-27-1955, 58 y.o.   MRN: [id number]  HPI Primary Physician: No primary provider on file.  Chief Complaint: Follow up HTN  HPI: 58 y.o. male with history below presents for follow up of HTN. At his last follow up we added lisinopril 10 mg in the evening to his regimen of lisinopril/HCTZ 10/12.5 mg daily. Today he comes in stating that his dizziness continues to improve. He will have an occasional episode here and there lasting 10-15 seconds while at rest. These episodes occur after using his car that has a busted head gasket and is leaking antifreeze. He can smell the antifreeze. It does irritate both his eyes and nose. His dizzy episodes never occur with exertion. He is able to work out on the treadmill without any symptoms. He really feels like the above symptoms are related to using his car. No chest pain, chest tightness, palpitations, diaphoresis, nausea, or vomiting.   Bloods pressure readings at home run in the 1-teens over 70's. No CP, headaches, vision changes, or focal deficits.      Past Medical History  Diagnosis Date  . Heart murmur   . Asthma   . Hyperlipidemia   . Sinus congestion      Home Meds: Prior to Admission medications   Medication Sig Start Date End Date Taking? Authorizing Provider  aspirin 81 MG tablet Take 81 mg by mouth daily.   Yes Historical Provider, MD  lisinopril (PRINIVIL,ZESTRIL) 10 MG tablet Take 1 tablet (10 mg total) by mouth daily. 03/05/13  Yes Brynley Cuddeback M Brodric Schauer, PA-C  lisinopril-hydrochlorothiazide (PRINZIDE,ZESTORETIC) 10-12.5 MG per tablet TAKE 1 TABLET BY MOUTH EVERY DAY 04/03/13  Yes Jada Kuhnert M Triton Heidrich, PA-C  simvastatin (ZOCOR) 20 MG tablet TAKE 1 TABLET (20 MG TOTAL) BY MOUTH EVERY EVENING. 03/05/13  Yes Shrey Boike M Thorne Wirz, PA-C    Allergies: No Known Allergies  History   Social History  . Marital Status: Married    Spouse Name: N/A    Number of Children: N/A  . 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 Smokeless tobacco: Never Used  . Alcohol Use: No  . Drug Use: No  . Sexual Activity: Yes   Other Topics Concern  . Not on file   Social History Narrative   Married.      Review of Systems  Constitutional: Negative for fever, chills, diaphoresis and fatigue.  Respiratory: Negative for apnea, cough, choking, chest tightness, shortness of breath, wheezing and stridor.   Cardiovascular: Negative for chest pain, palpitations and leg swelling.       Negative for chest tightness.   Gastrointestinal: Negative for nausea and vomiting.  Neurological: Positive for dizziness and light-headedness. Negative for tremors, seizures, syncope, facial asymmetry, speech difficulty, weakness, numbness and headaches.       Objective:   Physical Exam  Physical Exam: Blood pressure 108/92, pulse 74, temperature 97.9 F (36.6 C), temperature source Oral, resp. rate 16, height 5\' 7"  (1.702 m), weight 195 lb 12.8 oz (88.814 kg), SpO2 99.00%., Body mass index is 30.66 kg/(m^2). General: Well developed, well nourished, in no acute distress. Head: Normocephalic, atraumatic, eyes without discharge, sclera non-icteric, nares are without discharge. Bilateral auditory canals clear, TM's are without perforation, pearly grey and translucent with reflective cone of light bilaterally. Oral cavity moist, posterior pharynx without  exudate, erythema, peritonsillar abscess, or post nasal drip. Uvula midline.   Neck: Supple. No thyromegaly. Full ROM. No lymphadenopathy. No nuchal rigidity.  Lungs: Clear bilaterally to auscultation without wheezes, rales, or rhonchi. Breathing is unlabored. Heart: RRR with S1 S2. No murmurs, rubs, or gallops appreciated. Msk:  Strength and tone normal for age. Extremities/Skin: Warm and dry. No clubbing or cyanosis. No edema. No rashes or suspicious  lesions. Neuro: Alert and oriented X 3. Moves all extremities spontaneously. Gait is normal. CNII-XII grossly in tact. Psych:  Responds to questions appropriately with a normal affect.        Assessment & Plan:  58 year old male with hypertension and dizziness   1) Hypertension -Much improved -Continue lisinopril/HCTZ 10/12.5 mg daily -Healthy diet and exercise -Weight loss  2) Dizziness -This has been going on for quite sometime now and seems to be solely related him continuing to drive his car that has a leaking head gasket. This car is leaking antifreeze. He can smell the fluid, especially when the car comes to a stop. He does keep the window cracked. He notes some eye and nose irritation. His dizziness episodes last 10-15 seconds and self resolve. They are always associated after using the car. They occur when at rest, never with exertion.  -He will stop using this car for the next 4 weeks and see how his symptoms go. He will call with an update. If his symptoms persist we will proceed with an Event monitor as his symptoms are much further spread out at this time.  -Fix the car -RTC precautions -Stop the evening dose of lisinopril   Christell Faith, MHS, PA-C [facility] [address] [place], [place] [address] [facility] 04/21/2013 7:39 PM

## 2013-07-08 ENCOUNTER — Encounter: Payer: Self-pay | Admitting: Gastroenterology

## 2013-09-28 ENCOUNTER — Encounter: Payer: Self-pay | Admitting: Gastroenterology

## 2013-09-28 ENCOUNTER — Other Ambulatory Visit: Payer: Self-pay | Admitting: Family Medicine

## 2013-11-25 ENCOUNTER — Other Ambulatory Visit: Payer: Self-pay | Admitting: Physician Assistant

## 2013-11-25 ENCOUNTER — Ambulatory Visit (INDEPENDENT_AMBULATORY_CARE_PROVIDER_SITE_OTHER): Payer: 59 | Admitting: Physician Assistant

## 2013-11-25 VITALS — BP 122/72 | HR 81 | Temp 98.0°F | Resp 16 | Ht 66.0 in | Wt 177.0 lb

## 2013-11-25 DIAGNOSIS — Z0000 Encounter for general adult medical examination without abnormal findings: Secondary | ICD-10-CM

## 2013-11-25 DIAGNOSIS — E781 Pure hyperglyceridemia: Secondary | ICD-10-CM

## 2013-11-25 DIAGNOSIS — R195 Other fecal abnormalities: Secondary | ICD-10-CM

## 2013-11-25 DIAGNOSIS — I10 Essential (primary) hypertension: Secondary | ICD-10-CM

## 2013-11-25 DIAGNOSIS — Z1211 Encounter for screening for malignant neoplasm of colon: Secondary | ICD-10-CM

## 2013-11-25 DIAGNOSIS — Z8042 Family history of malignant neoplasm of prostate: Secondary | ICD-10-CM

## 2013-11-25 DIAGNOSIS — R040 Epistaxis: Secondary | ICD-10-CM

## 2013-11-25 LAB — LIPID PANEL
Cholesterol: 176 mg/dL (ref 0–200)
HDL: 54 mg/dL (ref >39)
LDL Cholesterol: 91 mg/dL (ref 0–99)
Total CHOL/HDL Ratio: 3.3 Ratio
Triglycerides: 153 mg/dL — ABNORMAL HIGH (ref <150)
VLDL: 31 mg/dL (ref 0–40)

## 2013-11-25 LAB — CBC
HCT: 43.7 % (ref 39.0–52.0)
HEMOGLOBIN: 14.9 g/dL (ref 13.0–17.0)
MCH: 28.7 pg (ref 26.0–34.0)
MCHC: 34.1 g/dL (ref 30.0–36.0)
MCV: 84.2 fL (ref 78.0–100.0)
Platelets: 239 10*3/uL (ref 150–400)
RBC: 5.19 MIL/uL (ref 4.22–5.81)
RDW: 13.4 % (ref 11.5–15.5)
WBC: 5.2 10*3/uL (ref 4.0–10.5)

## 2013-11-25 LAB — COMPREHENSIVE METABOLIC PANEL
ALT: 40 U/L (ref 0–53)
AST: 32 U/L (ref 0–37)
Albumin: 4.5 g/dL (ref 3.5–5.2)
Alkaline Phosphatase: 62 U/L (ref 39–117)
BILIRUBIN TOTAL: 0.8 mg/dL (ref 0.2–1.2)
BUN: 13 mg/dL (ref 6–23)
CALCIUM: 9.5 mg/dL (ref 8.4–10.5)
CO2: 27 meq/L (ref 19–32)
CREATININE: 0.98 mg/dL (ref 0.50–1.35)
Chloride: 104 mEq/L (ref 96–112)
GLUCOSE: 86 mg/dL (ref 70–99)
Potassium: 4.3 mEq/L (ref 3.5–5.3)
SODIUM: 141 meq/L (ref 135–145)
TOTAL PROTEIN: 7.3 g/dL (ref 6.0–8.3)

## 2013-11-25 LAB — POC HEMOCCULT BLD/STL (OFFICE/1-CARD/DIAGNOSTIC)
FECAL OCCULT BLD: NEGATIVE
OCCULT BLOOD DATE: NEGATIVE

## 2013-11-25 NOTE — Progress Notes (Signed)
Subjective:    Patient ID: Cory Baker, male    DOB: [birth year], 58 y.o.   MRN: [id number]  No PCP Per Patient  Chief Complaint  Patient presents with  . Annual Exam   Patient Active Problem List   Diagnosis Date Noted  . Fecal occult blood test positive 11/25/2013  . Hyperlipidemia LDL goal < 100 12/04/2011  . Family hx of prostate cancer 12/04/2011  . DDD (degenerative disc disease), cervical 12/04/2011  . Overweight 12/04/2011   Prior to Admission medications   Medication Sig Start Date End Date Taking? Authorizing Provider  lisinopril-hydrochlorothiazide (PRINZIDE,ZESTORETIC) 10-12.5 MG per tablet TAKE 1 TABLET BY MOUTH EVERY DAY 09/29/13  Yes Mancel Bale, PA-C  simvastatin (ZOCOR) 20 MG tablet TAKE 1 TABLET (20 MG TOTAL) BY MOUTH EVERY EVENING. 03/05/13  Yes Rise Mu, PA-C   Medications, allergies, past medical history, surgical history, family history, social history and problem list reviewed and updated.  HPI   34 yom with PMH htn, high cholesterol, polyp on 2009 colonoscopy, and fam hx prostate ca presents for annual physical.  He has been doing well since his last physical. We saw him approx 6 months ago for BP medication adjustment and occasional dizziness. His BP meds were reduced at that time. For the past year he has been exercising at a gym and watching his diet. He has really cut down on sweets and snacks, and is no longer eating at night. He has lost 19# since CPE last year.   Has been taking his BP at home every days. Several months ago it would sometimes run low in th 90s/60s. He was occasionally dizzy with this. Approx 1-2 months ago he decided to cut his prinzide in half, and has been taking 1/2 pill daily. His dizziness episodes have really decreased since this. His BP at home has been in the 120s/80s range and is in that range today.   He mentions that he has been having issues with left nosebleed past 3-4 months. He is always able to stop it with  direct pressure. He occasionally sues saline nasal spray. No other nasal sprays. Denies any other bleeding or easy bruising. He has these bleeds maybe once weekly. Denies dizziness/lightheadedness.   No other issues or complaints.  He is due for a colonoscopy as his last in 2009 showed one polyp. He had a positive FOBT prior to this colonoscopy, the bleed was thought to be due to the polyp.   His father died at age 76 of prostate cancer.    Review of Systems No CP, unusual SOB, palps, dysuria, HA, N/V, diarrhea, constipation.     Objective:   Physical Exam  Constitutional: He is oriented to person, place, and time. He appears well-developed and well-nourished.  Non-toxic appearance. He does not have a sickly appearance. He does not appear ill. No distress.  BP 122/72 mmHg  Pulse 81  Temp(Src) 98 F (36.7 C) (Oral)  Resp 16  Ht 5\' 6"  (1.676 m)  Wt 177 lb (80.287 kg)  BMI 28.58 kg/m2  SpO2 98%   HENT:  Head: Normocephalic and atraumatic.  Nose: Nose normal. No mucosal edema, rhinorrhea or nose lacerations. No epistaxis.  Mouth/Throat: Uvula is midline and oropharynx is clear and moist. No oropharyngeal exudate, posterior oropharyngeal edema or posterior oropharyngeal erythema.  Cardiovascular: Normal rate, regular rhythm, S1 normal, S2 normal and normal heart sounds.  Exam reveals no gallop.   No murmur heard. Pulses:  Dorsalis pedis pulses are 2+ on the right side, and 2+ on the left side.       Posterior tibial pulses are 2+ on the right side, and 2+ on the left side.  Pulmonary/Chest: Effort normal and breath sounds normal. He has no decreased breath sounds. He has no wheezes. He has no rhonchi. He has no rales.  Genitourinary: Rectum normal and prostate normal. Rectal exam shows anal tone normal. Guaiac negative stool. Prostate is not enlarged and not tender.  No nodules on prostate exam.   Lymphadenopathy:       Head (right side): No submental, no submandibular and no  tonsillar adenopathy present.       Head (left side): No submental, no submandibular and no tonsillar adenopathy present.    He has no cervical adenopathy.  Neurological: He is alert and oriented to person, place, and time. He has normal strength. No cranial nerve deficit or sensory deficit.  Skin: Skin is warm and dry. No bruising and no rash noted. Nails show no clubbing.  Psychiatric: He has a normal mood and affect. His speech is normal.      Assessment & Plan:   89 yom with PMH htn, high cholesterol, polyp on 2009 colonoscopy, and fam hx prostate ca presents for annual physical.  Annual Physical Exam --Nothing concerning on PE --No immunizations due --Labs/preventative maintenance as below  Essential hypertension - Plan: Comprehensive metabolic panel --BPs 809/98 at home and today on 1/2 pill prinzide daily, dizziness resolved --Start taking 1/2 pill every other day at home for 1-2 months, checking BP daily --If running 120/80 range for several months while taking every other day, D/C prinizide, continue BP checks, if creeping back up RTC  Nosebleed - Plan: CBC --PE unconcerning today --Nasal saline --Humidifier in room at night --CBC today --RTC if these measures don't help  Hypertriglyceridemia - Plan: Lipid panel --Continue zocor 20 for now  Special screening for malignant neoplasms, colon - Plan: Ambulatory referral to Gastroenterology --Polyp in 2009, due last year --Referral sent  FH: prostate cancer --Prostate exam normal --PSA today  Fecal occult blood test positive (History) - Plan: POC Hemoccult Bld/Stl (1-Cd Office Dx) --Thought to be due to colon polyp which was removed 2009, no positive FOBT since then --Repeated today due to need for prostate exam   Julieta Gutting, PA-C Physician Assistant-Certified [facility]  11/25/2013 12:39 PM

## 2013-11-25 NOTE — Progress Notes (Signed)
I have discussed this case with Mr. Rosanne Sack, [place] and agree.

## 2013-11-25 NOTE — Patient Instructions (Signed)
Thank you for coming back in today for your physical exam. Everything looked great. We'll let you know the lab results. For your nosebleeds, continue to use nasal saline daily, try a humidifier in your room at night. Let us know if these measures don't help.  Your prostate exam was normal today. We've referred you for a colonoscopy today, they'll contact you to schedule this.  For your blood pressure, start taking a 1/2 pill every other day, continue to check your BP daily at home. If it still runs in the 120/80 range, you can stop taking the pill altogether in 2-3 months. Continue to check your BP while doing this, if it starts to creep back up over 130/90, please let us know. Keep up the good work with diet and exercise!

## 2013-11-27 LAB — PSA: PSA: 0.77 ng/mL (ref <=4.00)

## 2013-12-23 ENCOUNTER — Encounter: Payer: Self-pay | Admitting: Gastroenterology

## 2013-12-25 ENCOUNTER — Encounter: Payer: 59 | Admitting: Family Medicine

## 2014-02-12 ENCOUNTER — Ambulatory Visit (AMBULATORY_SURGERY_CENTER): Payer: Self-pay | Admitting: *Deleted

## 2014-02-12 VITALS — Ht 66.0 in | Wt 186.6 lb

## 2014-02-12 DIAGNOSIS — Z86010 Personal history of colonic polyps: Secondary | ICD-10-CM

## 2014-02-12 MED ORDER — NA SULFATE-K SULFATE-MG SULF 17.5-3.13-1.6 GM/177ML PO SOLN: 1.0000 | Freq: Once | ORAL | Status: DC

## 2014-02-12 NOTE — Progress Notes (Signed)
No egg or soy allergy  No diet pills  No home 02 use  No  issues with past sedation  emmi to e mail  [email or web address]

## 2014-02-19 ENCOUNTER — Encounter: Payer: Self-pay | Admitting: Gastroenterology

## 2014-02-19 ENCOUNTER — Ambulatory Visit (AMBULATORY_SURGERY_CENTER): Payer: 59 | Admitting: Gastroenterology

## 2014-02-19 VITALS — BP 141/81 | HR 90 | Temp 97.7°F | Resp 29 | Ht 66.0 in | Wt 186.0 lb

## 2014-02-19 DIAGNOSIS — D122 Benign neoplasm of ascending colon: Secondary | ICD-10-CM

## 2014-02-19 DIAGNOSIS — Z86010 Personal history of colonic polyps: Secondary | ICD-10-CM

## 2014-02-19 DIAGNOSIS — D124 Benign neoplasm of descending colon: Secondary | ICD-10-CM

## 2014-02-19 MED ORDER — SODIUM CHLORIDE 0.9 % IV SOLN: 500.0000 mL | INTRAVENOUS | Status: DC

## 2014-02-19 NOTE — Op Note (Addendum)
[facility]  [address] [place], [address]   COLONOSCOPY PROCEDURE REPORT  PATIENT: Cory Baker, Cory Baker  MR#: [id number] BIRTHDATE: [birth year] , 58  yrs. old GENDER: male ENDOSCOPIST: Inda Castle, MD REFERRED WC:HENI Lauenstein, M.D. PROCEDURE DATE:  02/19/2014 PROCEDURE:   Colonoscopy with snare polypectomy First Screening Colonoscopy - Avg.  risk and is 50 yrs.  old or older - No.  Prior Negative Screening - Now for repeat screening. N/A  History of Adenoma - Now for follow-up colonoscopy & has been > or = to 3 yrs.  Yes hx of adenoma.  Has been 3 or more years since last colonoscopy.  Polyps Removed Today? Yes. ASA CLASS:   Class II INDICATIONS:high risk patient with personal history of colonic polyps. 2009 MEDICATIONS: Monitored anesthesia care and Propofol 300 mg IV  DESCRIPTION OF PROCEDURE:   After the risks benefits and alternatives of the procedure were thoroughly explained, informed consent was obtained.  The digital rectal exam revealed no abnormalities of the rectum.   The     endoscope was introduced through the anus and advanced to the cecum, which was identified by both the appendix and ileocecal valve. No adverse events experienced.   The quality of the prep was excellent using Suprep The instrument was then slowly withdrawn as the colon was fully examined.      COLON FINDINGS: A sessile polyp measuring 3 mm in size was found in the ascending colon.  A polypectomy was performed with a cold snare.  The resection was complete, the polyp tissue was completely retrieved and sent to histology.   The examination was otherwise normal.  Retroflexed views revealed no abnormalities. The time to cecum=5 minutes 07 seconds.  Withdrawal time=9 minutes 44 seconds. The scope was withdrawn and the procedure completed. COMPLICATIONS: There were no immediate complications.  ENDOSCOPIC IMPRESSION: 1.   Sessile polyp was found in the ascending colon;  polypectomy was performed with a cold snare 2.   The examination was otherwise normal  RECOMMENDATIONS: If the polyp(s) removed today are proven to be adenomatous (pre-cancerous) polyps, you will need a repeat colonoscopy in 5 years.  Otherwise you should continue to follow colorectal cancer screening guidelines for "routine risk" patients with colonoscopy in 10 years.  You will receive a letter within 1-2 weeks with the results of your biopsy as well as final recommendations.  Please call my office if you have not received a letter after 3 weeks.  eSigned:  Inda Castle, MD 02/19/2014 3:58 PM Revised: 02/19/2014 3:58 PM  cc:   PATIENT NAME:  Torri, Langston MR#: [id number]

## 2014-02-19 NOTE — Patient Instructions (Signed)
Discharge instructions given. Handout on polyps. Resume previous medications. YOU HAD AN ENDOSCOPIC PROCEDURE TODAY AT THE [facility]: Refer to the procedure report that was given to you for any specific questions about what was found during the examination.  If the procedure report does not answer your questions, please call your gastroenterologist to clarify.  If you requested that your care partner not be given the details of your procedure findings, then the procedure report has been included in a sealed envelope for you to review at your convenience later.  YOU SHOULD EXPECT: Some feelings of bloating in the abdomen. Passage of more gas than usual.  Walking can help get rid of the air that was put into your GI tract during the procedure and reduce the bloating. If you had a lower endoscopy (such as a colonoscopy or flexible sigmoidoscopy) you may notice spotting of blood in your stool or on the toilet paper. If you underwent a bowel prep for your procedure, then you may not have a normal bowel movement for a few days.  DIET: Your first meal following the procedure should be a light meal and then it is ok to progress to your normal diet.  A half-sandwich or bowl of soup is an example of a good first meal.  Heavy or fried foods are harder to digest and may make you feel nauseous or bloated.  Likewise meals heavy in dairy and vegetables can cause extra gas to form and this can also increase the bloating.  Drink plenty of fluids but you should avoid alcoholic beverages for 24 hours.  ACTIVITY: Your care partner should take you home directly after the procedure.  You should plan to take it easy, moving slowly for the rest of the day.  You can resume normal activity the day after the procedure however you should NOT DRIVE or use heavy machinery for 24 hours (because of the sedation medicines used during the test).    SYMPTOMS TO REPORT IMMEDIATELY: A gastroenterologist can be reached at any  hour.  During normal business hours, 8:30 AM to 5:00 PM Monday through Friday, call [phone].  After hours and on weekends, please call the GI answering service at [phone] who will take a message and have the physician on call contact you.   Following lower endoscopy (colonoscopy or flexible sigmoidoscopy):  Excessive amounts of blood in the stool  Significant tenderness or worsening of abdominal pains  Swelling of the abdomen that is new, acute  Fever of 100F or higher  FOLLOW UP: If any biopsies were taken you will be contacted by phone or by letter within the next 1-3 weeks.  Call your gastroenterologist if you have not heard about the biopsies in 3 weeks.  Our staff will call the home number listed on your records the next business day following your procedure to check on you and address any questions or concerns that you may have at that time regarding the information given to you following your procedure. This is a courtesy call and so if there is no answer at the home number and we have not heard from you through the emergency physician on call, we will assume that you have returned to your regular daily activities without incident.  SIGNATURES/CONFIDENTIALITY: You and/or your care partner have signed paperwork which will be entered into your electronic medical record.  These signatures attest to the fact that that the information above on your After Visit Summary has been reviewed and is   understood.  Full responsibility of the confidentiality of this discharge information lies with you and/or your care-partner. 

## 2014-02-19 NOTE — Progress Notes (Signed)
Called to room to assist during endoscopic procedure.  Patient ID and intended procedure confirmed with present staff. Received instructions for my participation in the procedure from the performing physician.  

## 2014-02-19 NOTE — Progress Notes (Signed)
Report to PACU, RN, vss, BBS= Clear.  

## 2014-02-22 ENCOUNTER — Telehealth: Payer: Self-pay

## 2014-02-22 NOTE — Telephone Encounter (Signed)
Left message on answering machine. 

## 2014-02-26 ENCOUNTER — Encounter: Payer: Self-pay | Admitting: Gastroenterology

## 2014-03-17 ENCOUNTER — Other Ambulatory Visit: Payer: Self-pay

## 2014-03-17 DIAGNOSIS — E781 Pure hyperglyceridemia: Secondary | ICD-10-CM

## 2014-03-17 MED ORDER — SIMVASTATIN 20 MG PO TABS
ORAL_TABLET | ORAL | Status: DC
Start: 2014-03-17 — End: 2014-10-23

## 2014-07-05 ENCOUNTER — Ambulatory Visit (INDEPENDENT_AMBULATORY_CARE_PROVIDER_SITE_OTHER): Payer: 59

## 2014-07-05 ENCOUNTER — Ambulatory Visit (INDEPENDENT_AMBULATORY_CARE_PROVIDER_SITE_OTHER): Payer: 59 | Admitting: Family Medicine

## 2014-07-05 VITALS — BP 118/72 | HR 75 | Temp 98.6°F | Resp 17 | Ht 67.0 in | Wt 192.2 lb

## 2014-07-05 DIAGNOSIS — M25512 Pain in left shoulder: Secondary | ICD-10-CM

## 2014-07-05 DIAGNOSIS — M6740 Ganglion, unspecified site: Secondary | ICD-10-CM

## 2014-07-05 DIAGNOSIS — R0989 Other specified symptoms and signs involving the circulatory and respiratory systems: Secondary | ICD-10-CM

## 2014-07-05 DIAGNOSIS — R198 Other specified symptoms and signs involving the digestive system and abdomen: Secondary | ICD-10-CM

## 2014-07-05 DIAGNOSIS — R05 Cough: Secondary | ICD-10-CM | POA: Diagnosis not present

## 2014-07-05 DIAGNOSIS — R059 Cough, unspecified: Secondary | ICD-10-CM

## 2014-07-05 DIAGNOSIS — R6889 Other general symptoms and signs: Secondary | ICD-10-CM

## 2014-07-05 MED ORDER — PREDNISONE 20 MG PO TABS: ORAL_TABLET | ORAL | Status: DC

## 2014-07-05 NOTE — Patient Instructions (Signed)
If the cough and throat discomfort does not clear in 2 days I want you to call me back. Occasionally this can come from the lisinopril even though you been on it for quite some time. Your symptoms worsen he may return here or go to the emergency department.   Ganglion Cyst A ganglion cyst is a noncancerous, fluid-filled lump that occurs near joints or tendons. The ganglion cyst grows out of a joint or the lining of a tendon. It most often develops in the hand or wrist but can also develop in the shoulder, elbow, hip, knee, ankle, or foot. The round or oval ganglion can be pea sized or larger than a grape. Increased activity may enlarge the size of the cyst because more fluid starts to build up.  CAUSES  It is not completely known what causes a ganglion cyst to grow. However, it may be related to:  Inflammation or irritation around the joint.  An injury.  Repetitive movements or overuse.  Arthritis. SYMPTOMS  A lump most often appears in the hand or wrist, but can occur in other areas of the body. Generally, the lump is painless without other symptoms. However, sometimes pain can be felt during activity or when pressure is applied to the lump. The lump may even be tender to the touch. Tingling, pain, numbness, or muscle weakness can occur if the ganglion cyst presses on a nerve. Your grip may be weak and you may have less movement in your joints.  DIAGNOSIS  Ganglion cysts are most often diagnosed based on a physical exam, noting where the cyst is and how it looks. Your caregiver will feel the lump and may shine a light alongside it. If it is a ganglion, a light often shines through it. Your caregiver may order an X-ray, ultrasound, or MRI to rule out other conditions. TREATMENT  Ganglions usually go away on their own without treatment. If pain or other symptoms are involved, treatment may be needed. Treatment is also needed if the ganglion limits your movement or if it gets infected. Treatment  options include:  Wearing a wrist or finger brace or splint.  Taking anti-inflammatory medicine.  Draining fluid from the lump with a needle (aspiration).  Injecting a steroid into the joint.  Surgery to remove the ganglion cyst and its stalk that is attached to the joint or tendon. However, ganglion cysts can grow back. HOME CARE INSTRUCTIONS   Do not press on the ganglion, poke it with a needle, or hit it with a heavy object. You may rub the lump gently and often. Sometimes fluid moves out of the cyst.  Only take medicines as directed by your caregiver.  Wear your brace or splint as directed by your caregiver. SEEK MEDICAL CARE IF:   Your ganglion becomes larger or more painful.  You have increased redness, red streaks, or swelling.  You have pus coming from the lump.  You have weakness or numbness in the affected area. MAKE SURE YOU:   Understand these instructions.  Will watch your condition.  Will get help right away if you are not doing well or get worse. Document Released: 12/30/1999 Document Revised: 09/26/2011 Document Reviewed: 02/25/2007 [facility] Patient Information 2015 [place], [place]. This information is not intended to replace advice given to you by your health care provider. Make sure you discuss any questions you have with your health care provider.

## 2014-07-05 NOTE — Progress Notes (Signed)
Is a 59 year old gentleman who works for the [employer]. Comes in with 2 separate problems.  First problem is 3 weeks of swelling in the lateral clavicular area which is mildly uncomfortable with moving his shoulder. He works out regularly and is in good shape. He does not recall a particular trauma to that area. Does not feel his range of motion is restricted.  The second problem is one week of tightness in his throat associated with cough. He's not had a fever or productive cough. Said no nausea or vomiting. He's had no shortness of breath.  Patient does have a history of asthma when he was a child but this is not surfaced since. He takes no inhalers. Of interest is that he is taking hydrochlorothiazide with lisinopril.  Objective: This is a middle-aged man in no acute distress BP 118/72 mmHg  Pulse 75  Temp(Src) 98.6 F (37 C) (Oral)  Resp 17  Ht 5\' 7"  (1.702 m)  Wt 192 lb 3.2 oz (87.181 kg)  BMI 30.10 kg/m2  SpO2 98% TMs: Normal Nose: Mildly restricted nasal passages. Oropharynx: Clear exudates or localized swelling Neck: Supple no adenopathy Chest: Clear to auscultation Heart: Regular without murmur or gallop Clavicle: Left clavicle has a 1-2 cm raised soft but nonfluctuant elevation. This is nontender. Left shoulder: Full range of motion Skin: No rash  [facility] reading (PRIMARY) by  Dr. Joseph Art:  Left clavicle shows STS over lateral clavicle..  This chart was scribed in my presence and reviewed by me personally.    ICD-9-CM ICD-10-CM   1. Clavicle pain, left 733.90 M25.512 DG Clavicle Left     Ambulatory referral to Orthopedic Surgery     predniSONE (DELTASONE) 20 MG tablet  2. Throat tightness 784.99 R19.8 Ambulatory referral to Orthopedic Surgery     predniSONE (DELTASONE) 20 MG tablet  3. Cough 786.2 R05 Ambulatory referral to Orthopedic Surgery     predniSONE (DELTASONE) 20 MG tablet  4. Ganglion cyst 727.43 M67.40 Ambulatory referral to Orthopedic Surgery   predniSONE (DELTASONE) 20 MG tablet   If throat is not improving in two days, stop the lisinopril  Signed, Robyn Haber, MD

## 2014-07-05 NOTE — Addendum Note (Signed)
Addended by: Robyn Haber on: 07/05/2014 04:51 PM   Modules accepted: Orders

## 2014-07-12 ENCOUNTER — Ambulatory Visit (INDEPENDENT_AMBULATORY_CARE_PROVIDER_SITE_OTHER): Payer: 59 | Admitting: Internal Medicine

## 2014-07-12 VITALS — BP 138/90 | HR 77 | Temp 98.8°F | Resp 18 | Ht 67.0 in | Wt 190.0 lb

## 2014-07-12 DIAGNOSIS — J309 Allergic rhinitis, unspecified: Secondary | ICD-10-CM

## 2014-07-12 DIAGNOSIS — R070 Pain in throat: Secondary | ICD-10-CM | POA: Diagnosis not present

## 2014-07-12 DIAGNOSIS — R590 Localized enlarged lymph nodes: Secondary | ICD-10-CM | POA: Diagnosis not present

## 2014-07-12 DIAGNOSIS — R059 Cough, unspecified: Secondary | ICD-10-CM

## 2014-07-12 DIAGNOSIS — R0989 Other specified symptoms and signs involving the circulatory and respiratory systems: Secondary | ICD-10-CM

## 2014-07-12 DIAGNOSIS — R6889 Other general symptoms and signs: Secondary | ICD-10-CM

## 2014-07-12 DIAGNOSIS — E041 Nontoxic single thyroid nodule: Secondary | ICD-10-CM

## 2014-07-12 DIAGNOSIS — R05 Cough: Secondary | ICD-10-CM | POA: Diagnosis not present

## 2014-07-12 DIAGNOSIS — R198 Other specified symptoms and signs involving the digestive system and abdomen: Secondary | ICD-10-CM

## 2014-07-12 LAB — POCT CBC
Granulocyte percent: 50.1 %G (ref 37–80)
HCT, POC: 44.2 % (ref 43.5–53.7)
Hemoglobin: 14.3 g/dL (ref 14.1–18.1)
Lymph, poc: 3.5 — AB (ref 0.6–3.4)
MCH: 27 pg (ref 27–31.2)
MCHC: 32.3 g/dL (ref 31.8–35.4)
MCV: 83.8 fL (ref 80–97)
MID (cbc): 0.4 (ref 0–0.9)
MPV: 8.4 fL (ref 0–99.8)
POC Granulocyte: 3.9 (ref 2–6.9)
POC LYMPH %: 45.2 % (ref 10–50)
POC MID %: 4.7 %M (ref 0–12)
Platelet Count, POC: 236 10*3/uL (ref 142–424)
RBC: 5.27 M/uL (ref 4.69–6.13)
RDW, POC: 12.6 %
WBC: 7.7 10*3/uL (ref 4.6–10.2)

## 2014-07-12 MED ORDER — AZITHROMYCIN 250 MG PO TABS: ORAL_TABLET | ORAL | Status: DC

## 2014-07-12 MED ORDER — CETIRIZINE-PSEUDOEPHEDRINE ER 5-120 MG PO TB12: 1.0000 | ORAL_TABLET | Freq: Two times a day (BID) | ORAL | Status: DC

## 2014-07-12 MED ORDER — BENZONATATE 100 MG PO CAPS: 100.0000 mg | ORAL_CAPSULE | Freq: Three times a day (TID) | ORAL | Status: DC | PRN

## 2014-07-12 MED ORDER — FLUTICASONE PROPIONATE 50 MCG/ACT NA SUSP: 2.0000 | Freq: Every day | NASAL | Status: DC

## 2014-07-12 NOTE — Progress Notes (Signed)
Subjective:    Patient ID: Cory Baker, male    DOB: [birth year], 59 y.o.   MRN: [id number]  Chief Complaint  Patient presents with  . Follow-up  . Cough  . Shortness of Breath   Patient Active Problem List   Diagnosis Date Noted  . Fecal occult blood test positive 11/25/2013  . Hyperlipidemia LDL goal < 100 12/04/2011  . Family hx of prostate cancer 12/04/2011  . DDD (degenerative disc disease), cervical 12/04/2011  . Overweight(278.02) 12/04/2011   Medications, allergies, past medical history, surgical history, family history, social history and problem list reviewed and updated.  HPI  59 yom presents with ongoing throat fullness and cough.   Was seen here last week for clavicle issue and mentioned throat tightness and mild cough at that time. Was told to stop lisinopril if cough persisted. He returns today.  He states sx started 2 wks ago like something was in his throat. Throat feels tight. Persistent past couple weeks. He feels like he gets tickle in back of throat with this throat tightness which causes him to cough. Cough is mild, non prod, throughout day past 2 wks. Denies fevers, chills, cp.   He feels he's constantly clearing his throat and is coughing due to "something in throat." Has hx asthma as child but has not been on inhalers for years and denies wheezing. Denies gerd or reflux sx. Denies dysphagia, odynphagia. Able to eat/drink without issue. Denies wt gain/loss, heat/cold intolerance, edema.   He exercises daily cardio and weights with no cp or unusual doe.   Has hx seasonal allergies in spring. He did have a dog move into house last month which is new for him.   Review of Systems See HPI.     Objective:   Physical Exam  Constitutional: He is oriented to person, place, and time. He appears well-developed and well-nourished.  Non-toxic appearance. He does not have a sickly appearance. He does not appear ill. No distress.  BP 138/90 mmHg  Pulse 77   Temp(Src) 98.8 F (37.1 C) (Oral)  Resp 18  Ht 5\' 7"  (1.702 m)  Wt 190 lb (86.183 kg)  BMI 29.75 kg/m2  SpO2 98%   HENT:  Right Ear: Tympanic membrane normal.  Left Ear: Tympanic membrane normal.  Nose: Mucosal edema and rhinorrhea present. Right sinus exhibits no maxillary sinus tenderness and no frontal sinus tenderness. Left sinus exhibits no maxillary sinus tenderness and no frontal sinus tenderness.  Mouth/Throat: Uvula is midline, oropharynx is clear and moist and mucous membranes are normal.  Neck: Thyroid mass present. No thyromegaly present.  Small palpable nodule left upper pole thyroid.   Cardiovascular: Normal rate, regular rhythm and normal heart sounds.   Pulmonary/Chest: Effort normal and breath sounds normal. No tachypnea.  Lymphadenopathy:       Head (right side): Submandibular adenopathy present. No submental and no tonsillar adenopathy present.       Head (left side): Submandibular adenopathy present. No submental and no tonsillar adenopathy present.    He has cervical adenopathy.       Right cervical: Superficial cervical adenopathy present. No deep cervical and no posterior cervical adenopathy present.      Left cervical: Superficial cervical adenopathy present. No deep cervical and no posterior cervical adenopathy present.  Neurological: He is alert and oriented to person, place, and time. No cranial nerve deficit.   Results for orders placed or performed in visit on 07/12/14  POCT CBC  Result Value Ref Range  WBC 7.7 4.6 - 10.2 K/uL   Lymph, poc 3.5 (A) 0.6 - 3.4   POC LYMPH PERCENT 45.2 10 - 50 %L   MID (cbc) 0.4 0 - 0.9   POC MID % 4.7 0 - 12 %M   POC Granulocyte 3.9 2 - 6.9   Granulocyte percent 50.1 37 - 80 %G   RBC 5.27 4.69 - 6.13 M/uL   Hemoglobin 14.3 14.1 - 18.1 g/dL   HCT, POC 44.2 43.5 - 53.7 %   MCV 83.8 80 - 97 fL   MCH, POC 27.0 27 - 31.2 pg   MCHC 32.3 31.8 - 35.4 g/dL   RDW, POC 12.6 %   Platelet Count, POC 236 142 - 424 K/uL   MPV  8.4 0 - 99.8 fL      Assessment & Plan:   59 yom presents with ongoing throat fullness and cough.   Throat discomfort - Plan: POCT CBC Throat fullness - Plan: US Soft Tissue Head/Neck Thyroid nodule - Plan: US Soft Tissue Head/Neck, TSH, T4, Free --cbc normal today --palpable small thyroid nodule, tsh/t4 today, referral for [place], pt denies hypo/hyper sx --referral to ENT if normal [place] and no relief with measures below  Cough - Plan: POCT CBC, benzonatate (TESSALON) 100 MG capsule  Submandibular lymphadenopathy - Plan: azithromycin (ZITHROMAX) 250 MG tablet  Allergic rhinitis, unspecified allergic rhinitis type - Plan: cetirizine-pseudoephedrine (ZYRTEC-D) 5-120 MG per tablet, fluticasone (FLONASE) 50 MCG/ACT nasal spray --suspect this is cause of throat irritation and cough, possibly from new dog exposure --zyrtec-d, flonase --hope treating this will treat post nasal drip, st, and cough --rtc if no relief 2 wks  Julieta Gutting, PA-C Physician Assistant-Certified [facility]  07/12/2014 10:10 PM  I have participated in the care of this patient with the Advanced Practice Provider and agree with Diagnosis and Plan as documented. Robert P. Laney Pastor, M.D.

## 2014-07-12 NOTE — Patient Instructions (Addendum)
I think allergies may be the cause of your symptoms as congestion can lead to post nasal drip which can lead to an irritated throat and cough.  Please take the zyrtec-d daily. Please use the flonase 2 sprays in each nostril daily. You do have swollen lymph nodes so we will give you an antibiotic. Please take two tabs the 1st day then 1 tab day for 4 days. We are checking thyroid levels today and referring you for a thyroid ultrasound due to some abnormality with your thyroid exam.  Please take the tessalon for cough up to three times daily as needed.  If you're not improved with these measures and the thyroid ultrasound is normal we will refer you to ENT. Please let us know.

## 2014-07-13 LAB — TSH: TSH: 1.044 u[IU]/mL (ref 0.350–4.500)

## 2014-07-13 LAB — T4, FREE: Free T4: 1.09 ng/dL (ref 0.80–1.80)

## 2014-07-26 ENCOUNTER — Ambulatory Visit
Admission: RE | Admit: 2014-07-26 | Discharge: 2014-07-26 | Disposition: A | Discharge status: Home / Self Care | Payer: 59 | Source: Ambulatory Visit | Attending: Physician Assistant | Admitting: Physician Assistant

## 2014-07-26 DIAGNOSIS — R0989 Other specified symptoms and signs involving the circulatory and respiratory systems: Secondary | ICD-10-CM

## 2014-07-26 DIAGNOSIS — E041 Nontoxic single thyroid nodule: Secondary | ICD-10-CM

## 2014-07-26 DIAGNOSIS — R6889 Other general symptoms and signs: Secondary | ICD-10-CM

## 2014-08-04 ENCOUNTER — Telehealth: Payer: Self-pay

## 2014-08-04 NOTE — Telephone Encounter (Signed)
Pt missed his CB from [facility] about his  imaging results. Please advise at [phone]

## 2014-08-06 NOTE — Telephone Encounter (Signed)
He was called back by xray.

## 2014-09-23 ENCOUNTER — Other Ambulatory Visit: Payer: Self-pay | Admitting: Physician Assistant

## 2014-10-23 ENCOUNTER — Other Ambulatory Visit: Payer: Self-pay | Admitting: Physician Assistant

## 2014-12-07 ENCOUNTER — Ambulatory Visit (INDEPENDENT_AMBULATORY_CARE_PROVIDER_SITE_OTHER): Payer: 59 | Admitting: Physician Assistant

## 2014-12-07 ENCOUNTER — Encounter: Payer: Self-pay | Admitting: Physician Assistant

## 2014-12-07 VITALS — BP 130/92 | HR 79 | Temp 98.3°F | Resp 16 | Ht 66.75 in | Wt 187.6 lb

## 2014-12-07 DIAGNOSIS — Z13228 Encounter for screening for other metabolic disorders: Secondary | ICD-10-CM

## 2014-12-07 DIAGNOSIS — Z114 Encounter for screening for human immunodeficiency virus [HIV]: Secondary | ICD-10-CM | POA: Diagnosis not present

## 2014-12-07 DIAGNOSIS — Z0000 Encounter for general adult medical examination without abnormal findings: Secondary | ICD-10-CM

## 2014-12-07 DIAGNOSIS — M20011 Mallet finger of right finger(s): Secondary | ICD-10-CM | POA: Diagnosis not present

## 2014-12-07 DIAGNOSIS — Z13220 Encounter for screening for lipoid disorders: Secondary | ICD-10-CM

## 2014-12-07 DIAGNOSIS — Z1329 Encounter for screening for other suspected endocrine disorder: Secondary | ICD-10-CM

## 2014-12-07 DIAGNOSIS — Z125 Encounter for screening for malignant neoplasm of prostate: Secondary | ICD-10-CM

## 2014-12-07 DIAGNOSIS — Z1159 Encounter for screening for other viral diseases: Secondary | ICD-10-CM

## 2014-12-07 DIAGNOSIS — Z130 Encounter for screening for diseases of the blood and blood-forming organs and certain disorders involving the immune mechanism: Secondary | ICD-10-CM

## 2014-12-07 LAB — CBC WITH DIFFERENTIAL/PLATELET
BASOS ABS: 0 10*3/uL (ref 0.0–0.1)
Basophils Relative: 0 % (ref 0–1)
Eosinophils Absolute: 0.1 10*3/uL (ref 0.0–0.7)
Eosinophils Relative: 1 % (ref 0–5)
HEMATOCRIT: 43.7 % (ref 39.0–52.0)
Hemoglobin: 15 g/dL (ref 13.0–17.0)
LYMPHS ABS: 2.5 10*3/uL (ref 0.7–4.0)
Lymphocytes Relative: 44 % (ref 12–46)
MCH: 29.1 pg (ref 26.0–34.0)
MCHC: 34.3 g/dL (ref 30.0–36.0)
MCV: 84.7 fL (ref 78.0–100.0)
MPV: 10.6 fL (ref 8.6–12.4)
Monocytes Absolute: 0.5 10*3/uL (ref 0.1–1.0)
Monocytes Relative: 8 % (ref 3–12)
Neutro Abs: 2.7 10*3/uL (ref 1.7–7.7)
Neutrophils Relative %: 47 % (ref 43–77)
Platelets: 233 10*3/uL (ref 150–400)
RBC: 5.16 MIL/uL (ref 4.22–5.81)
RDW: 12.7 % (ref 11.5–15.5)
WBC: 5.7 10*3/uL (ref 4.0–10.5)

## 2014-12-07 LAB — COMPREHENSIVE METABOLIC PANEL
ALK PHOS: 69 U/L (ref 40–115)
ALT: 52 U/L — ABNORMAL HIGH (ref 9–46)
AST: 31 U/L (ref 10–35)
Albumin: 4.7 g/dL (ref 3.6–5.1)
BUN: 11 mg/dL (ref 7–25)
CALCIUM: 9.6 mg/dL (ref 8.6–10.3)
CO2: 27 mmol/L (ref 20–31)
Chloride: 102 mmol/L (ref 98–110)
Creat: 0.86 mg/dL (ref 0.70–1.33)
Glucose, Bld: 83 mg/dL (ref 65–99)
POTASSIUM: 4 mmol/L (ref 3.5–5.3)
Sodium: 139 mmol/L (ref 135–146)
TOTAL PROTEIN: 7.2 g/dL (ref 6.1–8.1)
Total Bilirubin: 0.8 mg/dL (ref 0.2–1.2)

## 2014-12-07 LAB — LIPID PANEL
Cholesterol: 180 mg/dL (ref 125–200)
HDL: 51 mg/dL (ref >=40)
LDL CALC: 89 mg/dL (ref <130)
TRIGLYCERIDES: 200 mg/dL — AB (ref <150)
Total CHOL/HDL Ratio: 3.5 Ratio (ref <=5.0)
VLDL: 40 mg/dL — ABNORMAL HIGH (ref <30)

## 2014-12-07 LAB — TSH: TSH: 1.24 u[IU]/mL (ref 0.350–4.500)

## 2014-12-07 NOTE — Patient Instructions (Signed)
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
## 2014-12-08 ENCOUNTER — Encounter: Payer: Self-pay | Admitting: Physician Assistant

## 2014-12-08 LAB — PSA: PSA: 0.76 ng/mL (ref <=4.00)

## 2014-12-08 LAB — HIV ANTIBODY (ROUTINE TESTING W REFLEX): HIV 1&2 Ab, 4th Generation: NONREACTIVE

## 2014-12-08 LAB — HEPATITIS C ANTIBODY: HCV AB: NEGATIVE

## 2014-12-29 ENCOUNTER — Other Ambulatory Visit: Payer: Self-pay | Admitting: Physician Assistant

## 2015-02-02 ENCOUNTER — Other Ambulatory Visit: Payer: Self-pay | Admitting: Physician Assistant

## 2015-02-10 ENCOUNTER — Other Ambulatory Visit: Payer: Self-pay | Admitting: Physician Assistant

## 2015-08-21 ENCOUNTER — Other Ambulatory Visit: Payer: Self-pay | Admitting: Physician Assistant

## 2015-09-21 ENCOUNTER — Other Ambulatory Visit: Payer: Self-pay | Admitting: Physician Assistant

## 2016-01-10 ENCOUNTER — Encounter (HOSPITAL_COMMUNITY): Payer: Self-pay | Admitting: *Deleted

## 2016-01-10 ENCOUNTER — Emergency Department (HOSPITAL_COMMUNITY): Payer: 59

## 2016-01-10 DIAGNOSIS — J45909 Unspecified asthma, uncomplicated: Secondary | ICD-10-CM | POA: Insufficient documentation

## 2016-01-10 DIAGNOSIS — J069 Acute upper respiratory infection, unspecified: Secondary | ICD-10-CM | POA: Insufficient documentation

## 2016-01-10 DIAGNOSIS — J029 Acute pharyngitis, unspecified: Secondary | ICD-10-CM | POA: Diagnosis present

## 2016-01-10 DIAGNOSIS — R002 Palpitations: Secondary | ICD-10-CM | POA: Diagnosis not present

## 2016-01-10 LAB — BASIC METABOLIC PANEL
ANION GAP: 10 (ref 5–15)
BUN: 10 mg/dL (ref 6–20)
CALCIUM: 9.6 mg/dL (ref 8.9–10.3)
CO2: 25 mmol/L (ref 22–32)
Chloride: 101 mmol/L (ref 101–111)
Creatinine, Ser: 1.03 mg/dL (ref 0.61–1.24)
GFR calc Af Amer: >60 mL/min (ref >60)
GLUCOSE: 166 mg/dL — AB (ref 65–99)
Potassium: 4.1 mmol/L (ref 3.5–5.1)
Sodium: 136 mmol/L (ref 135–145)

## 2016-01-10 LAB — CBC
HCT: 38.8 % — ABNORMAL LOW (ref 39.0–52.0)
Hemoglobin: 13.6 g/dL (ref 13.0–17.0)
MCH: 29.6 pg (ref 26.0–34.0)
MCHC: 35.1 g/dL (ref 30.0–36.0)
MCV: 84.5 fL (ref 78.0–100.0)
Platelets: 258 10*3/uL (ref 150–400)
RBC: 4.59 MIL/uL (ref 4.22–5.81)
RDW: 12 % (ref 11.5–15.5)
WBC: 11.2 10*3/uL — ABNORMAL HIGH (ref 4.0–10.5)

## 2016-01-10 LAB — TROPONIN I

## 2016-01-10 NOTE — ED Triage Notes (Signed)
The pt has a cold and he took a musinex just before bedtime and his heart started beating fast.  He has been tajking cold meds for several days

## 2016-01-10 NOTE — ED Triage Notes (Signed)
The pt also has a fullness in his chest and esophagus

## 2016-01-11 ENCOUNTER — Emergency Department (HOSPITAL_COMMUNITY)
Admission: EM | Admit: 2016-01-11 | Discharge: 2016-01-11 | Disposition: A | Discharge status: Home / Self Care | Payer: 59 | Attending: Emergency Medicine | Admitting: Emergency Medicine

## 2016-01-11 DIAGNOSIS — R002 Palpitations: Secondary | ICD-10-CM

## 2016-01-11 DIAGNOSIS — J069 Acute upper respiratory infection, unspecified: Secondary | ICD-10-CM

## 2016-01-11 NOTE — ED Provider Notes (Signed)
[facility] DEPT Provider Note   CSN: [id number] Arrival date & time: 01/10/16  2256   By signing my name below, I, Eunice Blase, attest that this documentation has been prepared under the direction and in the presence of Orpah Greek, MD. Electronically signed, Eunice Blase, ED Scribe. 01/11/16. 2:51 AM.   History   Chief Complaint Chief Complaint  Patient presents with  . Tachycardia   The history is provided by the patient, medical records and the spouse. No language interpreter was used.    HPI Comments: Cory Baker is a 60 y.o. male who presents to the Emergency Department complaining of sore throat x several days. Pt reveals associated cough, fever, congestion and nasal drainage. He notes his heart began racing as he was preparing to go to sleep this evening. He notes he took robitussin and Mucinex night time earlier in the day. He notes he feels adequate relief from URI symptoms. He further notes his blood pressure is higher this evening than he is used to. Pt denies vomiting, diarrhea and chest pain   Past Medical History:  Diagnosis Date  . Asthma   . Heart murmur   . Hyperlipidemia   . Internal hemorrhoids   . Sinus congestion     Patient Active Problem List   Diagnosis Date Noted  . Mallet finger of right hand 12/07/2014  . Fecal occult blood test positive 11/25/2013  . Hyperlipidemia LDL goal < 100 12/04/2011  . Family hx of prostate cancer 12/04/2011  . DDD (degenerative disc disease), cervical 12/04/2011  . Overweight(278.02) 12/04/2011    Past Surgical History:  Procedure Laterality Date  . COLONOSCOPY     08-2007  . HAND TENDON SURGERY    . POLYPECTOMY         Home Medications    Prior to Admission medications   Medication Sig Start Date End Date Taking? Authorizing Provider  acetaminophen (TYLENOL) 500 MG chewable tablet Chew 1,000 mg by mouth every 6 (six) hours as needed for pain.   Yes Historical Provider, MD  Ascorbic  Acid (VITAMIN C) 1000 MG tablet Take 1,000 mg by mouth daily.   Yes Historical Provider, MD  fluticasone (FLONASE) 50 MCG/ACT nasal spray Place 2 sprays into both nostrils daily. 07/12/14  Yes Todd McVeigh, PA  guaiFENesin (ROBITUSSIN) 100 MG/5ML SOLN Take 10 mLs by mouth every 4 (four) hours as needed for cough or to loosen phlegm.   Yes Historical Provider, MD  Multiple Vitamin (MULTIVITAMIN) tablet Take 1 tablet by mouth daily.   Yes Historical Provider, MD  naproxen sodium (ANAPROX) 220 MG tablet Take 220 mg by mouth 2 (two) times daily as needed (pain).   Yes Historical Provider, MD  Phenylephrine-APAP-Guaifenesin (MUCINEX FAST-MAX) 10-650-400 MG/20ML LIQD Take 30 mLs by mouth daily as needed (cold).   Yes Historical Provider, MD  simvastatin (ZOCOR) 20 MG tablet TAKE 1 TABLET (20 MG TOTAL) BY MOUTH DAILY. Patient not taking: Reported on 01/11/2016 08/22/15   Harrison Mons, PA-C    Family History Family History  Problem Relation Age of Onset  . Kidney disease Mother   . Cancer Mother     breast  . Breast cancer Mother   . Hypertension Father   . Cancer Father 39    Prostate  . Cancer Sister   . Mental illness Sister   . Heart disease Brother   . Diabetes Brother   . Mental illness Brother   . Asthma Son   . Colon cancer Neg Hx  Social History Social History  Substance Use Topics  . Smoking status: Never Smoker  . Smokeless tobacco: Never Used  . Alcohol use No     Allergies   Patient has no known allergies.   Review of Systems Review of Systems  All other systems reviewed and are negative.  A complete 10 system review of systems was obtained and all systems are negative except as noted in the HPI and PMH.    Physical Exam Updated Vital Signs BP 152/89 (BP Location: Right Arm)   Pulse 95   Temp 98.6 F (37 C)   Resp 16   Ht 5\' 6"  (1.676 m)   Wt 180 lb (81.6 kg)   SpO2 98%   BMI 29.05 kg/m   Physical Exam  Constitutional: He is oriented to person,  place, and time. He appears well-developed and well-nourished. No distress.  HENT:  Head: Normocephalic and atraumatic.  Right Ear: Hearing normal.  Left Ear: Hearing normal.  Nose: Nose normal.  Mouth/Throat: Oropharynx is clear and moist and mucous membranes are normal.  Eyes: Conjunctivae and EOM are normal. Pupils are equal, round, and reactive to light.  Neck: Normal range of motion. Neck supple.  Cardiovascular: Regular rhythm, S1 normal and S2 normal.  Exam reveals no gallop and no friction rub.   No murmur heard. Pulmonary/Chest: Effort normal and breath sounds normal. No respiratory distress. He exhibits no tenderness.  Abdominal: Soft. Normal appearance and bowel sounds are normal. There is no hepatosplenomegaly. There is no tenderness. There is no rebound, no guarding, no tenderness at McBurney's point and negative Murphy's sign. No hernia.  Musculoskeletal: Normal range of motion.  Neurological: He is alert and oriented to person, place, and time. He has normal strength. No cranial nerve deficit or sensory deficit. Coordination normal. GCS eye subscore is 4. GCS verbal subscore is 5. GCS motor subscore is 6.  Skin: Skin is warm, dry and intact. No rash noted. No cyanosis.  Psychiatric: He has a normal mood and affect. His speech is normal and behavior is normal. Thought content normal.  Nursing note and vitals reviewed.    ED Treatments / Results  DIAGNOSTIC STUDIES: Oxygen Saturation is 98% on RA, normal by my interpretation.    COORDINATION OF CARE: 2:51 AM Discussed treatment plan with pt at bedside and pt agreed to plan.  Labs (all labs ordered are listed, but only abnormal results are displayed) Labs Reviewed  BASIC METABOLIC PANEL - Abnormal; Notable for the following:       Result Value   Glucose, Bld 166 (*)    All other components within normal limits  CBC - Abnormal; Notable for the following:    WBC 11.2 (*)    HCT 38.8 (*)    All other components within  normal limits  TROPONIN I    EKG  EKG Interpretation  Date/Time:  Tuesday January 10 2016 23:04:48 EST Ventricular Rate:  110 PR Interval:  160 QRS Duration: 84 QT Interval:  332 QTC Calculation: 449 R Axis:   68 Text Interpretation:  Sinus tachycardia Right atrial enlargement Borderline ECG No previous tracing Confirmed by Betsey Holiday  MD, Hermila Millis [phone]) on 01/11/2016 2:25:35 AM       Radiology Dg Chest 2 View  Result Date: 01/10/2016 CLINICAL DATA:  Tachycardia and chest pain. EXAM: CHEST  2 VIEW COMPARISON:  None. FINDINGS: The cardiomediastinal contours are normal. The lungs are clear. Pulmonary vasculature is normal. No consolidation, pleural effusion, or pneumothorax. No acute osseous  abnormalities are seen. Minimal broad-based rightward scoliotic curvature of the thoracic spine. IMPRESSION: No active cardiopulmonary disease. Electronically Signed   By: Jeb Levering M.D.   On: 01/10/2016 23:43    Procedures Procedures (including critical care time)  Medications Ordered in ED Medications - No data to display   Initial Impression / Assessment and Plan / ED Course  I have reviewed the triage vital signs and the nursing notes.  Pertinent labs & imaging results that were available during my care of the patient were reviewed by me and considered in my medical decision making (see chart for details).  Clinical Course     Pt symptoms consistent with URI. CXR negative for acute infiltrate. Patient exhibiting tachycardia initially which is resolving. He now thinks that he double up on decongestant medication tonight. This is likely an effect of the decongestants, will not require further workup. Pt will be discharged with symptomatic treatment.  Discussed return precautions.  Pt is hemodynamically stable & in NAD prior to discharge.   Final Clinical Impressions(s) / ED Diagnoses   Final diagnoses:  Upper respiratory tract infection, unspecified type  Palpitations     New Prescriptions New Prescriptions   No medications on file  I personally performed the services described in this documentation, which was scribed in my presence. The recorded information has been reviewed and is accurate.    Orpah Greek, MD 01/11/16 [phone]

## 2016-01-13 DIAGNOSIS — I10 Essential (primary) hypertension: Secondary | ICD-10-CM | POA: Insufficient documentation

## 2016-11-13 DIAGNOSIS — Q2112 Patent foramen ovale: Secondary | ICD-10-CM | POA: Insufficient documentation

## 2019-02-06 ENCOUNTER — Encounter (HOSPITAL_COMMUNITY): Payer: Self-pay | Admitting: Emergency Medicine

## 2019-02-06 ENCOUNTER — Other Ambulatory Visit: Payer: Self-pay

## 2019-02-06 ENCOUNTER — Ambulatory Visit (HOSPITAL_COMMUNITY)
Admission: EM | Admit: 2019-02-06 | Discharge: 2019-02-06 | Disposition: A | Discharge status: Home / Self Care | Payer: 59 | Attending: Family Medicine | Admitting: Family Medicine

## 2019-02-06 DIAGNOSIS — R030 Elevated blood-pressure reading, without diagnosis of hypertension: Secondary | ICD-10-CM | POA: Insufficient documentation

## 2019-02-06 DIAGNOSIS — R42 Dizziness and giddiness: Secondary | ICD-10-CM | POA: Insufficient documentation

## 2019-02-06 LAB — POCT URINALYSIS DIP (DEVICE)
Bilirubin Urine: NEGATIVE
Glucose, UA: NEGATIVE mg/dL
Hgb urine dipstick: NEGATIVE
Ketones, ur: NEGATIVE mg/dL
Leukocytes,Ua: NEGATIVE
Nitrite: NEGATIVE
Protein, ur: NEGATIVE mg/dL
Specific Gravity, Urine: 1.01 (ref 1.005–1.030)
Urobilinogen, UA: 0.2 mg/dL (ref 0.0–1.0)
pH: 7 (ref 5.0–8.0)

## 2019-02-06 LAB — BASIC METABOLIC PANEL
Anion gap: 9 (ref 5–15)
BUN: 10 mg/dL (ref 8–23)
CO2: 26 mmol/L (ref 22–32)
Calcium: 9.9 mg/dL (ref 8.9–10.3)
Chloride: 105 mmol/L (ref 98–111)
Creatinine, Ser: 0.91 mg/dL (ref 0.61–1.24)
GFR calc Af Amer: >60 mL/min (ref >60)
GFR calc non Af Amer: >60 mL/min (ref >60)
Glucose, Bld: 86 mg/dL (ref 70–99)
Potassium: 3.9 mmol/L (ref 3.5–5.1)
Sodium: 140 mmol/L (ref 135–145)

## 2019-02-06 LAB — CBC
HCT: 44.5 % (ref 39.0–52.0)
Hemoglobin: 15.4 g/dL (ref 13.0–17.0)
MCH: 29.2 pg (ref 26.0–34.0)
MCHC: 34.6 g/dL (ref 30.0–36.0)
MCV: 84.4 fL (ref 80.0–100.0)
Platelets: 255 10*3/uL (ref 150–400)
RBC: 5.27 MIL/uL (ref 4.22–5.81)
RDW: 11.8 % (ref 11.5–15.5)
WBC: 5.7 10*3/uL (ref 4.0–10.5)
nRBC: 0 % (ref 0.0–0.2)

## 2019-02-06 LAB — GLUCOSE, CAPILLARY: Glucose-Capillary: 98 mg/dL (ref 70–99)

## 2019-02-06 LAB — CBG MONITORING, ED: Glucose-Capillary: 98 mg/dL (ref 70–99)

## 2019-02-06 NOTE — ED Triage Notes (Signed)
Pt here for htn and feeling "fuzzy headed" earlier today; pt denies being on htn meds; BP noted elevated provider aware

## 2019-02-06 NOTE — Discharge Instructions (Signed)
You urine and ekg look great today. I am checking some basic labs to ensure these are also normal, will call you if there is anything concerning.  You can continue to monitor your blood pressure once a day or every other day, don't over-check it either as this can elevate it.  Continue with regular exercises and low salt/ sodium diet.  Follow up with your PCP for recheck.  Please return if your light headedness returns, worsens, develop chest pain, headache, dizziness, sweating, nausea, leg swelling, or otherwise worsening or concerned.

## 2019-02-06 NOTE — ED Provider Notes (Signed)
[facility]    CSN: [id number] Arrival date & time: 02/06/19  1424      History   Chief Complaint Chief Complaint  Patient presents with  . Hypertension    HPI Cory Baker is a 64 y.o. male.   Cory Baker presents with complaints of elevated blood pressure. States approximately 5 years ago he had a similar episode, saw his doctor, no new medications. He felt light headed this morning, which he has in the past as well, has spoke to his docto about this. But this after noon felt like he could pass out due to the sensation. Took his BP of 179/88. He checks his BP at home and it is typically in the 120's-140's/ 70's. He exercises regularly. No chest pain . No shortness of breath . No headache. He has noted increased urination. No increased thirst. No leg swelling. He has mild light headed sensation now. No fevers. No URI symptoms. No gi symptoms. No dizziness. No weakness. No other cardiac history.     ROS per HPI, negative if not otherwise mentioned.      Past Medical History:  Diagnosis Date  . Asthma   . Heart murmur   . Hyperlipidemia   . Internal hemorrhoids   . Sinus congestion     Patient Active Problem List   Diagnosis Date Noted  . Mallet finger of right hand 12/07/2014  . Fecal occult blood test positive 11/25/2013  . Hyperlipidemia LDL goal < 100 12/04/2011  . Family hx of prostate cancer 12/04/2011  . DDD (degenerative disc disease), cervical 12/04/2011  . Overweight(278.02) 12/04/2011    Past Surgical History:  Procedure Laterality Date  . COLONOSCOPY     08-2007  . HAND TENDON SURGERY    . POLYPECTOMY         Home Medications    Prior to Admission medications   Medication Sig Start Date End Date Taking? Authorizing Provider  acetaminophen (TYLENOL) 500 MG chewable tablet Chew 1,000 mg by mouth every 6 (six) hours as needed for pain.    [provider]  Ascorbic Acid (VITAMIN C) 1000 MG tablet Take 1,000 mg by mouth  daily.    [provider]  fluticasone (FLONASE) 50 MCG/ACT nasal spray Place 2 sprays into both nostrils daily. 07/12/14   McVeigh, Sherren Mocha, PA  guaiFENesin (ROBITUSSIN) 100 MG/5ML SOLN Take 10 mLs by mouth every 4 (four) hours as needed for cough or to loosen phlegm.    [provider]  Multiple Vitamin (MULTIVITAMIN) tablet Take 1 tablet by mouth daily.    [provider]  naproxen sodium (ANAPROX) 220 MG tablet Take 220 mg by mouth 2 (two) times daily as needed (pain).    [provider]  Phenylephrine-APAP-Guaifenesin ([place] FAST-MAX) 10-650-400 MG/20ML LIQD Take 30 mLs by mouth daily as needed (cold).    [provider]  simvastatin (ZOCOR) 20 MG tablet TAKE 1 TABLET (20 MG TOTAL) BY MOUTH DAILY. Patient not taking: Reported on 01/11/2016 08/22/15   Harrison Mons, PA    Family History Family History  Problem Relation Age of Onset  . Kidney disease Mother   . Cancer Mother        breast  . Breast cancer Mother   . Hypertension Father   . Cancer Father 51       Prostate  . Cancer Sister   . Mental illness Sister   . Heart disease Brother   . Diabetes Brother   . Mental  illness Brother   . Asthma Son   . Colon cancer Neg Hx     Social History Social History   Tobacco Use  . Smoking status: Never Smoker  . Smokeless tobacco: Never Used  Substance Use Topics  . Alcohol use: No    Alcohol/week: 0.0 standard drinks  . Drug use: No     Allergies   Patient has no known allergies.   Review of Systems Review of Systems   Physical Exam Triage Vital Signs ED Triage Vitals [02/06/19 1438]  Enc Vitals Group     BP (!) 203/103     Pulse Rate 90     Resp 18     Temp 98.8 F (37.1 C)     Temp Source Oral     SpO2 97 %     Weight      Height      Head Circumference      Peak Flow      Pain Score 0     Pain Loc      Pain Edu?      Excl. in [place]?    No data found.  Updated Vital Signs BP (!) 163/92   Pulse 90   Temp  98.8 F (37.1 C) (Oral)   Resp 18   SpO2 97%    Physical Exam Constitutional:      Appearance: He is well-developed.  HENT:     Head: Normocephalic and atraumatic.  Cardiovascular:     Rate and Rhythm: Normal rate and regular rhythm.  Pulmonary:     Effort: Pulmonary effort is normal. No respiratory distress.     Breath sounds: Normal breath sounds.  Musculoskeletal:     Right lower leg: No edema.     Left lower leg: No edema.  Skin:    General: Skin is warm and dry.  Neurological:     General: No focal deficit present.     Mental Status: He is alert and oriented to person, place, and time.     Cranial Nerves: No cranial nerve deficit.     Sensory: No sensory deficit.     Motor: No weakness.     Gait: Gait normal.     EKG:  NSR rate of 90 . Previous EKG was available for review. No stwave changes as interpreted by me.   UC Treatments / Results  Labs (all labs ordered are listed, but only abnormal results are displayed) Labs Reviewed  GLUCOSE, CAPILLARY  BASIC METABOLIC PANEL  CBC  CBG MONITORING, ED  POCT URINALYSIS DIP (DEVICE)    EKG   Radiology No results found.  Procedures Procedures (including critical care time)  Medications Ordered in UC Medications - No data to display  Initial Impression / Assessment and Plan / UC Course  I have reviewed the triage vital signs and the nursing notes.  Pertinent labs & imaging results that were available during my care of the patient were reviewed by me and considered in my medical decision making (see chart for details).     BP 200/100 initially. Down to 160/90 at time of discharge. No red flag neurological findings. No chest pain . No swelling. No shortness of breath . Urine without protein. Normal blood sugar. Normal EKG. CBC and BMP collected and pending as well. BP's have been normal up until this point at home, this seems to be isolated today. No new medications initiated. Encouraged close follow up with PCP  for recheck. Return precautions provided. Patient  verbalized understanding and agreeable to plan.  Ambulatory out of clinic without difficulty.    Final Clinical Impressions(s) / UC Diagnoses   Final diagnoses:  Elevated blood pressure reading  Light headedness     Discharge Instructions     You urine and ekg look great today. I am checking some basic labs to ensure these are also normal, will call you if there is anything concerning.  You can continue to monitor your blood pressure once a day or every other day, don't over-check it either as this can elevate it.  Continue with regular exercises and low salt/ sodium diet.  Follow up with your PCP for recheck.  Please return if your light headedness returns, worsens, develop chest pain, headache, dizziness, sweating, nausea, leg swelling, or otherwise worsening or concerned.     ED Prescriptions    None     PDMP not reviewed this encounter.   Zigmund Gottron, NP 02/06/19 1645

## 2019-03-18 ENCOUNTER — Encounter: Payer: 59 | Admitting: Gastroenterology

## 2019-04-21 ENCOUNTER — Other Ambulatory Visit: Payer: Self-pay

## 2019-04-21 ENCOUNTER — Ambulatory Visit (AMBULATORY_SURGERY_CENTER): Payer: Self-pay | Admitting: *Deleted

## 2019-04-21 VITALS — Temp 98.2°F | Ht 66.75 in | Wt 187.8 lb

## 2019-04-21 DIAGNOSIS — Z86010 Personal history of colonic polyps: Secondary | ICD-10-CM

## 2019-04-21 NOTE — Progress Notes (Signed)

## 2019-05-01 ENCOUNTER — Encounter: Payer: Self-pay | Admitting: Gastroenterology

## 2019-05-05 ENCOUNTER — Other Ambulatory Visit: Payer: Self-pay

## 2019-05-05 ENCOUNTER — Encounter: Payer: Self-pay | Admitting: Gastroenterology

## 2019-05-05 ENCOUNTER — Ambulatory Visit (AMBULATORY_SURGERY_CENTER): Payer: 59 | Admitting: Gastroenterology

## 2019-05-05 VITALS — BP 95/57 | HR 88 | Temp 96.6°F | Resp 23 | Ht 66.0 in | Wt 187.8 lb

## 2019-05-05 DIAGNOSIS — D126 Benign neoplasm of colon, unspecified: Secondary | ICD-10-CM

## 2019-05-05 DIAGNOSIS — K5520 Angiodysplasia of colon without hemorrhage: Secondary | ICD-10-CM | POA: Diagnosis not present

## 2019-05-05 DIAGNOSIS — D120 Benign neoplasm of cecum: Secondary | ICD-10-CM

## 2019-05-05 DIAGNOSIS — Z860100 Personal history of colon polyps, unspecified: Secondary | ICD-10-CM

## 2019-05-05 DIAGNOSIS — D123 Benign neoplasm of transverse colon: Secondary | ICD-10-CM

## 2019-05-05 MED ORDER — SODIUM CHLORIDE 0.9 % IV SOLN: 500.0000 mL | Freq: Once | INTRAVENOUS | Status: DC

## 2019-05-05 NOTE — Patient Instructions (Signed)
Please read handouts provided. Continue present medications. Await pathology results. High fiber diet. Use FiberCon 1 tablet daily.      YOU HAD AN ENDOSCOPIC PROCEDURE TODAY AT [facility]:   Refer to the procedure report that was given to you for any specific questions about what was found during the examination.  If the procedure report does not answer your questions, please call your gastroenterologist to clarify.  If you requested that your care partner not be given the details of your procedure findings, then the procedure report has been included in a sealed envelope for you to review at your convenience later.  YOU SHOULD EXPECT: Some feelings of bloating in the abdomen. Passage of more gas than usual.  Walking can help get rid of the air that was put into your GI tract during the procedure and reduce the bloating. If you had a lower endoscopy (such as a colonoscopy or flexible sigmoidoscopy) you may notice spotting of blood in your stool or on the toilet paper. If you underwent a bowel prep for your procedure, you may not have a normal bowel movement for a few days.  Please Note:  You might notice some irritation and congestion in your nose or some drainage.  This is from the oxygen used during your procedure.  There is no need for concern and it should clear up in a day or so.  SYMPTOMS TO REPORT IMMEDIATELY:   Following lower endoscopy (colonoscopy or flexible sigmoidoscopy):  Excessive amounts of blood in the stool  Significant tenderness or worsening of abdominal pains  Swelling of the abdomen that is new, acute  Fever of 100F or higher   For urgent or emergent issues, a gastroenterologist can be reached at any hour by calling [phone]. Do not use MyChart messaging for urgent concerns.    DIET:  We do recommend a small meal at first, but then you may proceed to your regular diet.  Drink plenty of fluids but you should avoid alcoholic beverages for 24  hours.  ACTIVITY:  You should plan to take it easy for the rest of today and you should NOT DRIVE or use heavy machinery until tomorrow (because of the sedation medicines used during the test).    FOLLOW UP: Our staff will call the number listed on your records 48-72 hours following your procedure to check on you and address any questions or concerns that you may have regarding the information given to you following your procedure. If we do not reach you, we will leave a message.  We will attempt to reach you two times.  During this call, we will ask if you have developed any symptoms of COVID 19. If you develop any symptoms (ie: fever, flu-like symptoms, shortness of breath, cough etc.) before then, please call [phone].  If you test positive for Covid 19 in the 2 weeks post procedure, please call and report this information to [place].    If any biopsies were taken you will be contacted by phone or by letter within the next 1-3 weeks.  Please call us at [phone] if you have not heard about the biopsies in 3 weeks.    SIGNATURES/CONFIDENTIALITY: You and/or your care partner have signed paperwork which will be entered into your electronic medical record.  These signatures attest to the fact that that the information above on your After Visit Summary has been reviewed and is understood.  Full responsibility of the confidentiality of this discharge information lies  with you and/or your care-partner.

## 2019-05-05 NOTE — Progress Notes (Signed)
Report to PACU, RN, vss, BBS= Clear.  

## 2019-05-05 NOTE — Progress Notes (Signed)
Pt's states no medical or surgical changes since previsit or office visit.  SB IV, DT vitals and JB temp.

## 2019-05-05 NOTE — Op Note (Signed)
[facility] Patient Name: Cory Baker Procedure Date: 05/05/2019 8:57 AM MRN: [id number] Endoscopist: Justice Britain , MD Age: 64 Referring MD:  Date of Birth: 1955/08/27 Gender: Male Account #: [id number] Procedure:                Colonoscopy Indications:              Surveillance: Personal history of adenomatous                            polyps on last colonoscopy 5 years ago Medicines:                Monitored Anesthesia Care Procedure:                Pre-Anesthesia Assessment:                           - Prior to the procedure, a History and Physical                            was performed, and patient medications and                            allergies were reviewed. The patient's tolerance of                            previous anesthesia was also reviewed. The risks                            and benefits of the procedure and the sedation                            options and risks were discussed with the patient.                            All questions were answered, and informed consent                            was obtained. Prior Anticoagulants: The patient has                            taken no previous anticoagulant or antiplatelet                            agents except for aspirin. ASA Grade Assessment: II                            - A patient with mild systemic disease. After                            reviewing the risks and benefits, the patient was                            deemed in satisfactory condition to undergo the  procedure.                           After obtaining informed consent, the colonoscope                            was passed under direct vision. Throughout the                            procedure, the patient's blood pressure, pulse, and                            oxygen saturations were monitored continuously. The                            Colonoscope was introduced through the anus and                          advanced to the 4 cm into the ileum. The                            colonoscopy was performed without difficulty. The                            patient tolerated the procedure. The quality of the                            bowel preparation was adequate. The terminal ileum,                            ileocecal valve, appendiceal orifice, and rectum                            were photographed. Scope In: 9:11:49 AM Scope Out: 9:35:46 AM Scope Withdrawal Time: 0 hours 15 minutes 10 seconds  Total Procedure Duration: 0 hours 23 minutes 57 seconds  Findings:                 The digital rectal exam findings include                            hemorrhoids. Pertinent negatives include no                            palpable rectal lesions.                           The colon (entire examined portion) was                            significantly redundant.                           Three sessile polyps were found in the transverse  colon (1), hepatic flexure (1), and cecum (1). The                            polyps were 3 to 10 mm in size. These polyps were                            removed with a cold snare. Resection and retrieval                            were complete.                           A single medium-sized angioectasia with typical                            arborization was found in the cecum.                           Normal mucosa was found in the entire colon                            otherwise.                           Anal papillae were hypertrophied and noted.                           Non-bleeding non-thrombosed internal hemorrhoids                            were found during retroflexion, during perianal                            exam and during digital exam. The hemorrhoids were                            Grade II (internal hemorrhoids that prolapse but                            reduce spontaneously). Complications:             No immediate complications. Estimated Blood Loss:     Estimated blood loss was minimal. Impression:               - Hemorrhoids found on digital rectal exam.                           - Redundant colon.                           - Three 3 to 10 mm polyps in the transverse colon,                            at the hepatic flexure and in the cecum, removed  with a cold snare. Resected and retrieved.                           - A single colonic angioectasia.                           - Normal mucosa in the entire examined colon.                           - Anal papilla were hypertrophied with Non-bleeding                            non-thrombosed internal hemorrhoids. Recommendation:           - The patient will be observed post-procedure,                            until all discharge criteria are met.                           - Discharge patient to home.                           - Patient has a contact number available for                            emergencies. The signs and symptoms of potential                            delayed complications were discussed with the                            patient. Return to normal activities tomorrow.                            Written discharge instructions were provided to the                            patient.                           - High fiber diet.                           - Use FiberCon 1 tablet PO daily.                           - Continue present medications.                           - Await pathology results.                           - Repeat colonoscopy in likely 3 years for                            surveillance based on  pathology results.                           - The findings and recommendations were discussed                            with the patient. Justice Britain, MD 05/05/2019 9:42:27 AM

## 2019-05-05 NOTE — Progress Notes (Signed)
Called to room to assist during endoscopic procedure.  Patient ID and intended procedure confirmed with present staff. Received instructions for my participation in the procedure from the performing physician.  

## 2019-05-07 ENCOUNTER — Telehealth: Payer: Self-pay

## 2019-05-07 ENCOUNTER — Telehealth: Payer: Self-pay | Admitting: *Deleted

## 2019-05-07 NOTE — Telephone Encounter (Signed)
  Follow up Call-  Call back number 05/05/2019  Post procedure Call Back phone  # [phone]  Permission to leave phone message Yes  Some recent data might be hidden     Patient questions:  Do you have a fever, pain , or abdominal swelling? No. Pain Score  0 *  Have you tolerated food without any problems? Yes.    Have you been able to return to your normal activities? Yes.    Do you have any questions about your discharge instructions: Diet   No. Medications  No. Follow up visit  No.  Do you have questions or concerns about your Care? No.  Actions: * If pain score is 4 or above: No action needed, pain <4.  1. Have you developed a fever since your procedure? no  2.   Have you had an respiratory symptoms (SOB or cough) since your procedure? no  3.   Have you tested positive for COVID 19 since your procedure no  4.   Have you had any family members/close contacts diagnosed with the COVID 19 since your procedure? no   If yes to any of these questions please route to Joylene John, RN and Erenest Rasher, RN

## 2019-05-07 NOTE — Telephone Encounter (Signed)
Pt already had follow up call, no need for another call.

## 2019-05-08 ENCOUNTER — Encounter: Payer: Self-pay | Admitting: Gastroenterology

## 2019-07-26 ENCOUNTER — Other Ambulatory Visit: Payer: Self-pay

## 2019-07-26 ENCOUNTER — Ambulatory Visit (HOSPITAL_COMMUNITY)
Admission: EM | Admit: 2019-07-26 | Discharge: 2019-07-26 | Disposition: A | Discharge status: Home / Self Care | Payer: 59 | Attending: Family Medicine | Admitting: Family Medicine

## 2019-07-26 ENCOUNTER — Encounter (HOSPITAL_COMMUNITY): Payer: Self-pay

## 2019-07-26 DIAGNOSIS — T63441A Toxic effect of venom of bees, accidental (unintentional), initial encounter: Secondary | ICD-10-CM | POA: Diagnosis not present

## 2019-07-26 MED ORDER — METHYLPREDNISOLONE ACETATE 80 MG/ML IJ SUSP
80.0000 mg | Freq: Once | INTRAMUSCULAR | Status: AC
  Administered 2019-07-26: 80 mg via INTRAMUSCULAR

## 2019-07-26 MED ORDER — METHYLPREDNISOLONE ACETATE 80 MG/ML IJ SUSP
INTRAMUSCULAR | Status: AC
  Filled 2019-07-26: qty 1

## 2019-07-26 NOTE — Discharge Instructions (Signed)
Cool compresses, ice application to help with swelling and itching. May use topical hydrocortisone as needed.  Continue with antihistamine regularly- benadryl or claritin.

## 2019-07-26 NOTE — ED Triage Notes (Signed)
Pt presents to UC for bee sting to left hand yesterday. Upon assessment left hand swollen and red. Pt denies pain in left hand, endorses "tightness", and itching. Pt denies OTC treatments or relieving factors.

## 2019-07-26 NOTE — ED Provider Notes (Signed)
[name]    CSN: [id number] Arrival date & time: 07/26/19  1404      History   Chief Complaint Chief Complaint  Patient presents with   Insect Bite    HPI Cory Baker is a 64 y.o. male.   Cory Baker presents with complaints of redness, swelling, itching to left hand. Was stung by a bee yesterday. Symptoms have worsened. Feels tight. No oral or respiratory symptoms. Takes daily claritin already. No other treatments tried for relief.    ROS per HPI, negative if not otherwise mentioned.      Past Medical History:  Diagnosis Date   Allergy    mils seasonal   Asthma    Heart murmur    Hyperlipidemia    Hypertension    Internal hemorrhoids    Sinus congestion     Patient Active Problem List   Diagnosis Date Noted   Mallet finger of right hand 12/07/2014   Fecal occult blood test positive 11/25/2013   Hyperlipidemia LDL goal < 100 12/04/2011   Family hx of prostate cancer 12/04/2011   DDD (degenerative disc disease), cervical 12/04/2011   Overweight(278.02) 12/04/2011    Past Surgical History:  Procedure Laterality Date   COLONOSCOPY     08-2007   HAND TENDON SURGERY     POLYPECTOMY         Home Medications    Prior to Admission medications   Medication Sig Start Date End Date Taking? Authorizing Provider  atorvastatin (LIPITOR) 20 MG tablet Take 20 mg by mouth daily. 03/30/19  Yes [provider]  Multiple Vitamin (MULTIVITAMIN) tablet Take 1 tablet by mouth daily.   Yes [provider]  acetaminophen (TYLENOL) 500 MG chewable tablet Chew 1,000 mg by mouth every 6 (six) hours as needed for pain.    [provider]  amLODipine (NORVASC) 5 MG tablet Take 5 mg by mouth daily. Patient not taking: Reported on 07/26/2019 04/21/19   [provider]  Ascorbic Acid (VITAMIN C) 1000 MG tablet Take 1,000 mg by mouth daily.    [provider]  aspirin 81 MG chewable tablet Chew by  mouth daily. Patient not taking: Reported on 07/26/2019    [provider]  fluticasone (FLONASE) 50 MCG/ACT nasal spray Place 2 sprays into both nostrils daily. Patient not taking: Reported on 04/21/2019 07/12/14   Araceli Bouche, PA  guaiFENesin (ROBITUSSIN) 100 MG/5ML SOLN Take 10 mLs by mouth every 4 (four) hours as needed for cough or to loosen phlegm.    [provider]  naproxen sodium (ANAPROX) 220 MG tablet Take 220 mg by mouth 2 (two) times daily as needed (pain).    [provider]  Phenylephrine-APAP-Guaifenesin ([place] FAST-MAX) 10-650-400 MG/20ML LIQD Take 30 mLs by mouth daily as needed (cold).    [provider]    Family History Family History  Problem Relation Age of Onset   Kidney disease Mother    Cancer Mother        breast   Breast cancer Mother    Hypertension Father    Cancer Father 98       Prostate   Cancer Sister    Mental illness Sister    Heart disease Brother    Diabetes Brother    Mental illness Brother    Asthma Son    Colon cancer Neg Hx    Colon polyps Neg Hx    Esophageal cancer Neg Hx    Rectal cancer Neg  Hx    Stomach cancer Neg Hx     Social History Social History   Tobacco Use   Smoking status: Never Smoker   Smokeless tobacco: Never Used  Substance Use Topics   Alcohol use: No    Alcohol/week: 0.0 standard drinks   Drug use: No     Allergies   Patient has no known allergies.   Review of Systems Review of Systems   Physical Exam Triage Vital Signs ED Triage Vitals  Enc Vitals Group     BP 07/26/19 1442 (!) 153/78     Pulse Rate 07/26/19 1442 71     Resp 07/26/19 1442 16     Temp 07/26/19 1442 98.2 F (36.8 C)     Temp Source 07/26/19 1442 Oral     SpO2 07/26/19 1442 99 %     Weight --      Height --      Head Circumference --      Peak Flow --      Pain Score 07/26/19 1445 0     Pain Loc --      Pain Edu? --      Excl. in [name] --    No data  found.  Updated Vital Signs BP (!) 153/78 (BP Location: Right Arm)    Pulse 71    Temp 98.2 F (36.8 C) (Oral)    Resp 16    SpO2 99%    Physical Exam Constitutional:      Appearance: He is well-developed.  Cardiovascular:     Rate and Rhythm: Normal rate.  Pulmonary:     Effort: Pulmonary effort is normal.  Musculoskeletal:     Left hand: Swelling present.     Comments: Left hand very red warm and swollen, originated to dorsum of thumb and pointer metacarpal and extending to hand and radial aspect of wrist; no skin breakdown or blistering. Sensation intact, full ROM of hand only somewhat limited by swelling but no pain  Skin:    General: Skin is warm and dry.  Neurological:     Mental Status: He is alert and oriented to person, place, and time.      UC Treatments / Results  Labs (all labs ordered are listed, but only abnormal results are displayed) Labs Reviewed - No data to display  EKG   Radiology No results found.  Procedures Procedures (including critical care time)  Medications Ordered in UC Medications  methylPREDNISolone acetate (DEPO-MEDROL) injection 80 mg (80 mg Intramuscular Given 07/26/19 1537)    Initial Impression / Assessment and Plan / UC Course  I have reviewed the triage vital signs and the nursing notes.  Pertinent labs & imaging results that were available during my care of the patient were reviewed by me and considered in my medical decision making (see chart for details).     Known bee sting with localized reaction. Impressive swelling. Ring finger ring removed for safety. Depo medrol im provided. Encouraged to continue with antihistamines, cool compresses. Return precautions provided. Patient verbalized understanding and agreeable to plan.   Final Clinical Impressions(s) / UC Diagnoses   Final diagnoses:  Bee sting reaction, accidental or unintentional, initial encounter     Discharge Instructions     Cool compresses, ice  application to help with swelling and itching. May use topical hydrocortisone as needed.  Continue with antihistamine regularly- benadryl or claritin.     ED Prescriptions    None     PDMP not reviewed  this encounter.   Zigmund Gottron, NP 07/26/19 1639

## 2019-08-02 ENCOUNTER — Emergency Department (HOSPITAL_COMMUNITY): Payer: 59

## 2019-08-02 ENCOUNTER — Emergency Department (HOSPITAL_COMMUNITY)
Admission: EM | Admit: 2019-08-02 | Discharge: 2019-08-02 | Disposition: A | Discharge status: Home / Self Care | Payer: 59 | Attending: Emergency Medicine | Admitting: Emergency Medicine

## 2019-08-02 ENCOUNTER — Encounter (HOSPITAL_COMMUNITY): Payer: Self-pay

## 2019-08-02 DIAGNOSIS — Y929 Unspecified place or not applicable: Secondary | ICD-10-CM | POA: Insufficient documentation

## 2019-08-02 DIAGNOSIS — Y9389 Activity, other specified: Secondary | ICD-10-CM | POA: Insufficient documentation

## 2019-08-02 DIAGNOSIS — Z7982 Long term (current) use of aspirin: Secondary | ICD-10-CM | POA: Insufficient documentation

## 2019-08-02 DIAGNOSIS — S53125A Posterior dislocation of left ulnohumeral joint, initial encounter: Secondary | ICD-10-CM

## 2019-08-02 DIAGNOSIS — Y999 Unspecified external cause status: Secondary | ICD-10-CM | POA: Insufficient documentation

## 2019-08-02 DIAGNOSIS — Z79899 Other long term (current) drug therapy: Secondary | ICD-10-CM | POA: Insufficient documentation

## 2019-08-02 DIAGNOSIS — T1490XA Injury, unspecified, initial encounter: Secondary | ICD-10-CM

## 2019-08-02 DIAGNOSIS — S59902A Unspecified injury of left elbow, initial encounter: Secondary | ICD-10-CM | POA: Diagnosis present

## 2019-08-02 DIAGNOSIS — J45909 Unspecified asthma, uncomplicated: Secondary | ICD-10-CM | POA: Diagnosis not present

## 2019-08-02 DIAGNOSIS — I10 Essential (primary) hypertension: Secondary | ICD-10-CM | POA: Diagnosis not present

## 2019-08-02 DIAGNOSIS — W11XXXA Fall on and from ladder, initial encounter: Secondary | ICD-10-CM | POA: Diagnosis not present

## 2019-08-02 MED ORDER — OXYCODONE-ACETAMINOPHEN 5-325 MG PO TABS: 1.0000 | ORAL_TABLET | Freq: Four times a day (QID) | ORAL | 0 refills | Status: DC | PRN

## 2019-08-02 MED ORDER — PROPOFOL 10 MG/ML IV BOLUS
0.5000 mg/kg | Freq: Once | INTRAVENOUS | Status: AC
  Administered 2019-08-02: 20 mg via INTRAVENOUS
  Filled 2019-08-02: qty 20

## 2019-08-02 MED ORDER — SODIUM CHLORIDE 0.9 % IV SOLN
INTRAVENOUS | Status: AC | PRN
  Administered 2019-08-02: 500 mL via INTRAVENOUS

## 2019-08-02 MED ORDER — HYDROMORPHONE HCL 1 MG/ML IJ SOLN
1.0000 mg | Freq: Once | INTRAMUSCULAR | Status: AC
  Administered 2019-08-02: 1 mg via INTRAVENOUS
  Filled 2019-08-02: qty 1

## 2019-08-02 MED ORDER — KETAMINE HCL 50 MG/5ML IJ SOSY
1.0000 mg/kg | PREFILLED_SYRINGE | Freq: Once | INTRAMUSCULAR | Status: AC
  Administered 2019-08-02: 20 mg via INTRAVENOUS
  Filled 2019-08-02: qty 10

## 2019-08-02 NOTE — ED Provider Notes (Signed)
[facility] DEPARTMENT Provider Note   CSN: [id number] Arrival date & time: 08/02/19  1305     History Chief Complaint  Patient presents with  . Arm Injury    Cory Baker is a 64 y.o. male.  The history is provided by the patient and a significant other. No language interpreter was used.  Arm Injury Associated symptoms: no fever      64 year old male with history of hypertension, heart murmur, hyperlipidemia, brought here via EMS from home for evaluation of a recent fall.  Patient report prior to arrival he was at home however washing his house while standing on a 10 foot ladder.  He accidentally slipped, slight down from the ladder and was trying to grab onto the ladder to prevent hitting the ground.  In the process his left nondominant arm was stuck in between the ladder rungs and was twisted.  He fell forward to the ground but denies hitting his head or having any loss of consciousness.  He then noticed acute onset of throbbing crampy pain to his left elbow.  Pain initially intense, improves once EMS arrived and gave him pain medication and now pain has returned.  Pain is nonradiating.  No complaints of headache, neck pain, chest pain, trouble breathing, abdominal pain, back pain, pain to his hips or his lower extremities.  He is up-to-date with tetanus.  He denies any precipitating symptoms prior to the fall.  A c-collar was applied at the scene.  Does not complain of any significant neck pain.  Patient last ate approximately 3 hours ago.     Past Medical History:  Diagnosis Date  . Allergy    mils seasonal  . Asthma   . Heart murmur   . Hyperlipidemia   . Hypertension   . Internal hemorrhoids   . Sinus congestion     Patient Active Problem List   Diagnosis Date Noted  . Mallet finger of right hand 12/07/2014  . Fecal occult blood test positive 11/25/2013  . Hyperlipidemia LDL goal < 100 12/04/2011  . Family hx of prostate cancer  12/04/2011  . DDD (degenerative disc disease), cervical 12/04/2011  . Overweight(278.02) 12/04/2011    Past Surgical History:  Procedure Laterality Date  . COLONOSCOPY     08-2007  . HAND TENDON SURGERY    . POLYPECTOMY         Family History  Problem Relation Age of Onset  . Kidney disease Mother   . Cancer Mother        breast  . Breast cancer Mother   . Hypertension Father   . Cancer Father 83       Prostate  . Cancer Sister   . Mental illness Sister   . Heart disease Brother   . Diabetes Brother   . Mental illness Brother   . Asthma Son   . Colon cancer Neg Hx   . Colon polyps Neg Hx   . Esophageal cancer Neg Hx   . Rectal cancer Neg Hx   . Stomach cancer Neg Hx     Social History   Tobacco Use  . Smoking status: Never Smoker  . Smokeless tobacco: Never Used  Substance Use Topics  . Alcohol use: No    Alcohol/week: 0.0 standard drinks  . Drug use: No    Home Medications Prior to Admission medications   Medication Sig Start Date End Date Taking? Authorizing Provider  acetaminophen (TYLENOL) 500 MG chewable tablet Chew 1,000  mg by mouth every 6 (six) hours as needed for pain.    [provider]  amLODipine (NORVASC) 5 MG tablet Take 5 mg by mouth daily. Patient not taking: Reported on 07/26/2019 04/21/19   [provider]  Ascorbic Acid (VITAMIN C) 1000 MG tablet Take 1,000 mg by mouth daily.    [provider]  aspirin 81 MG chewable tablet Chew by mouth daily. Patient not taking: Reported on 07/26/2019    [provider]  atorvastatin (LIPITOR) 20 MG tablet Take 20 mg by mouth daily. 03/30/19   [provider]  fluticasone (FLONASE) 50 MCG/ACT nasal spray Place 2 sprays into both nostrils daily. Patient not taking: Reported on 04/21/2019 07/12/14   Araceli Bouche, PA  guaiFENesin (ROBITUSSIN) 100 MG/5ML SOLN Take 10 mLs by mouth every 4 (four) hours as needed for cough or to loosen phlegm.    [provider]    Multiple Vitamin (MULTIVITAMIN) tablet Take 1 tablet by mouth daily.    [provider]  naproxen sodium (ANAPROX) 220 MG tablet Take 220 mg by mouth 2 (two) times daily as needed (pain).    [provider]  Phenylephrine-APAP-Guaifenesin ([place] FAST-MAX) 10-650-400 MG/20ML LIQD Take 30 mLs by mouth daily as needed (cold).    [provider]    Allergies    Patient has no known allergies.  Review of Systems   Review of Systems  Constitutional: Negative for fever.  Musculoskeletal: Positive for arthralgias.  Skin: Negative for wound.  Neurological: Negative for numbness.    Physical Exam Updated Vital Signs BP (!) 156/88   Pulse 92   Temp 98.6 F (37 C) (Oral)   Resp (!) 22   Wt 83.9 kg   SpO2 99%   BMI 29.86 kg/m   Physical Exam Vitals and nursing note reviewed.  Constitutional:      General: He is not in acute distress.    Appearance: He is well-developed. He is diaphoretic.  HENT:     Head: Normocephalic and atraumatic.  Eyes:     Conjunctiva/sclera: Conjunctivae normal.  Cardiovascular:     Rate and Rhythm: Normal rate and regular rhythm.     Pulses: Normal pulses.     Heart sounds: Normal heart sounds.  Pulmonary:     Effort: Pulmonary effort is normal.     Breath sounds: Normal breath sounds.  Abdominal:     Palpations: Abdomen is soft.     Tenderness: There is no abdominal tenderness.  Musculoskeletal:        General: Deformity (Left arm: Arm is in a splint, obvious close deformity noted to left elbow with exquisite tenderness to palpation.  Sensation is intact distally.  No tenderness to left wrist or left hand.  Able to grip, radial pulse 2+, arm compartment is soft. ) present.     Cervical back: Neck supple.     Comments: Left shoulder nontender, no tenderness along midline spine.  Skin:    Findings: No rash.  Neurological:     Mental Status: He is alert.     ED Results / Procedures / Treatments   Labs (all labs  ordered are listed, but only abnormal results are displayed) Labs Reviewed - No data to display  EKG None  Radiology DG Elbow Complete Left  Result Date: 08/02/2019 CLINICAL DATA:  Left elbow injury due to a fall from a ladder today. Initial encounter. EXAM: LEFT ELBOW - COMPLETE 3+ VIEW COMPARISON:  None. FINDINGS: The elbow is posteriorly  and laterally dislocated. Small bone fragment seen on the lateral view just superior to the olecranon is likely off the coronoid process. Soft tissues are swollen. IMPRESSION: Posterior, lateral elbow dislocation with a likely chip fracture off the coronoid process. Electronically Signed   By: Inge Rise M.D.   On: 08/02/2019 14:42   DG Humerus Left  Result Date: 08/02/2019 CLINICAL DATA:  Left upper arm injury due to a fall from a ladder today. Initial encounter. EXAM: LEFT HUMERUS - 2+ VIEW COMPARISON:  None. FINDINGS: Dislocated left elbow is noted as seen on dedicated plain films of the elbow today. No other acute abnormality. Acromioclavicular and glenohumeral degenerative change noted. IMPRESSION: Dislocated left elbow.  No other acute abnormality. Acromioclavicular and glenohumeral degenerative disease. Electronically Signed   By: Inge Rise M.D.   On: 08/02/2019 14:45    Procedures .Ortho Injury Treatment  Date/Time: 08/02/2019 3:21 PM Performed by: Domenic Moras, PA-C Authorized by: Domenic Moras, PA-C   Consent:    Consent obtained:  Written   Consent given by:  Patient   Risks discussed:  Fracture, vascular damage and irreducible dislocation   Alternatives discussed:  No treatment, alternative treatment and referralInjury location: elbow Location details: left elbow Injury type: dislocation Dislocation type: posterior Pre-procedure neurovascular assessment: neurovascularly intact Pre-procedure distal perfusion: normal Pre-procedure neurological function: normal Pre-procedure range of motion: reduced  Anesthesia: Local  anesthesia used: no  Patient sedated: Yes. Refer to sedation procedure documentation for details of sedation. Manipulation performed: yes Reduction method: manipulation of proximal ulna and flexion Reduction successful: yes X-ray confirmed reduction: yes Immobilization: sling Splint type: shoulder immobilizer. Post-procedure neurovascular assessment: post-procedure neurovascularly intact Post-procedure distal perfusion: normal Post-procedure neurological function: normal Post-procedure range of motion: normal Patient tolerance: patient tolerated the procedure well with no immediate complications    (including critical care time)  Medications Ordered in ED Medications  HYDROmorphone (DILAUDID) injection 1 mg (1 mg Intravenous Given 08/02/19 1342)  ketamine 50 mg in normal saline 5 mL (10 mg/mL) syringe (20 mg Intravenous Given 08/02/19 1518)  propofol (DIPRIVAN) 10 mg/mL bolus/IV push 42 mg (20 mg Intravenous Given 08/02/19 1518)  0.9 %  sodium chloride infusion (500 mLs Intravenous New Bag/Given 08/02/19 1524)    ED Course  I have reviewed the triage vital signs and the nursing notes.  Pertinent labs & imaging results that were available during my care of the patient were reviewed by me and considered in my medical decision making (see chart for details).  Clinical Course as of Aug 02 1423  Sun Aug 02, 2019  1342 Pt seen and examined- reports increasing pain severity and pain is located at posterior elbow. Will order XR elbow and consult orthopedics.   [AS]    Clinical Course User Index [AS] Sagun, Amelia, Student-PA   MDM Rules/Calculators/A&P                          BP (!) 168/89   Pulse 73   Temp 97.8 F (36.6 C) (Oral)   Resp (!) 23   Wt 83.9 kg   SpO2 98%   BMI 29.86 kg/m   Final Clinical Impression(s) / ED Diagnoses Final diagnoses:  Closed traumatic posterior dislocation of left elbow joint, initial encounter    Rx / DC Orders ED Discharge Orders          Ordered    oxyCODONE-acetaminophen (PERCOCET) 5-325 MG tablet  Every 6 hours PRN     Discontinue  Reprint     08/02/19 1617         1:39 PM Patient lost control and slipped off his 10 feet ladder and his left arm was caught in between the rungs of the ladder on the way down.  He has an obvious closed deformity to the left elbow with exquisite tenderness.  He does not have any at this focal tenderness.  He has a small skin tear 1 to the left anterior mid tib-fib minimal tenderness to palpation and another one on left hand.  No precipitating symptoms prior to the fall.  No foot pain no knee pain or back pain.  No headache.  Will obtain appropriate x-ray, pain medication given.  Anticipate patient will require reduction of this injury.  Last meal was 3 hours ago.  3:22 PM Pt suffered a L posterior lateral elbow dislocation with a likely chip fx off the coronoid process.  This is a closed injury.  I was able to reduced the elbow under procedural sedation (ketofol), under direct supervision of Dr. Tamera Punt.  Post reduction xray ordered.  Pt tolerates well.    4:20 PM Successful reducation of the L elbow on post reduction xray.  Pt is NVI post splinting.  outpt f/u recommended.  Pain medication prescribed. Return precaution given.   SPLINT APPLICATION Date/Time: 6:57 PM Authorized by: Domenic Moras Consent: Verbal consent obtained. Risks and benefits: risks, benefits and alternatives were discussed Consent given by: patient Splint applied by: orthopedic technician Location details: left elbow Splint type: should immobilization Supplies used: sling Post-procedure: The splinted body part was neurovascularly unchanged following the procedure. Patient tolerance: Patient tolerated the procedure well with no immediate complications.      Domenic Moras, PA-C 08/02/19 1622    Malvin Johns, MD 08/03/19 [phone]

## 2019-08-02 NOTE — Discharge Instructions (Signed)
You have been diagnosed with a left elbow dislocation.  Wear sling for support.  Take pain medications as needed but be aware that it may cause drowsiness.  Follow up with orthopedist for further care.

## 2019-08-02 NOTE — Progress Notes (Signed)
Orthopedic Tech Progress Note Patient Details:  Cory Baker October 31, 1955 [id number] Elbow reduction, ortho applied shoulder immobilizer Ortho Devices Type of Ortho Device: Shoulder immobilizer Ortho Device/Splint Location: LUE Ortho Device/Splint Interventions: Ordered, Application, Adjustment   Post Interventions Patient Tolerated: Well Instructions Provided: Care of device   [name] 08/02/2019, 3:51 PM

## 2019-08-02 NOTE — ED Notes (Signed)
Patient verbalizes understanding of discharge instructions. Opportunity for questioning and answers were provided. Armband removed by staff, pt discharged from ED via wheelchair to leave with significant other

## 2019-08-02 NOTE — ED Provider Notes (Signed)
.  Sedation  Date/Time: 08/02/2019 3:24 PM Performed by: Malvin Johns, MD Authorized by: Malvin Johns, MD   Consent:    Consent obtained:  Written   Consent given by:  Patient   Risks discussed:  Allergic reaction, prolonged hypoxia resulting in organ damage, prolonged sedation necessitating reversal, respiratory compromise necessitating ventilatory assistance and intubation, inadequate sedation and vomiting   Alternatives discussed:  Analgesia without sedation Universal protocol:    Procedure explained and questions answered to patient or proxy's satisfaction: yes     Relevant documents present and verified: yes     Test results available and properly labeled: yes     Imaging studies available: yes     Required blood products, implants, devices, and special equipment available: yes     Site/side marked: yes     Immediately prior to procedure a time out was called: yes     Patient identity confirmation method:  Arm band Indications:    Procedure performed:  Dislocation reduction   Procedure necessitating sedation performed by:  Different physician Pre-sedation assessment:    Time since last food or drink:  3 hours   ASA classification: class 1 - normal, healthy patient     Neck mobility: normal     Mouth opening:  3 or more finger widths   Thyromental distance:  4 finger widths   Mallampati score:  II - soft palate, uvula, fauces visible   Pre-sedation assessments completed and reviewed: airway patency, cardiovascular function, hydration status, mental status, nausea/vomiting, pain level, respiratory function and temperature     Pre-sedation assessment completed:  08/02/2019 2:50 PM Immediate pre-procedure details:    Reassessment: Patient reassessed immediately prior to procedure     Reviewed: vital signs and NPO status     Verified: bag valve mask available, emergency equipment available, intubation equipment available, IV patency confirmed, oxygen available and suction available    Procedure details (see MAR for exact dosages):    Preoxygenation:  Nasal cannula   Sedation:  Ketamine and propofol   Intended level of sedation: deep   Intra-procedure monitoring:  Blood pressure monitoring, cardiac monitor, continuous capnometry, continuous pulse oximetry, frequent LOC assessments and frequent vital sign checks   Intra-procedure events: none     Total Provider sedation time (minutes):  5 Post-procedure details:    Post-sedation assessment completed:  08/02/2019 3:26 PM   Attendance: Constant attendance by certified staff until patient recovered     Recovery: Patient returned to pre-procedure baseline     Post-sedation assessments completed and reviewed: airway patency, cardiovascular function, hydration status, mental status, nausea/vomiting, pain level, respiratory function and temperature     Patient is stable for discharge or admission: yes (following repeat imaging and oral trial)     Patient tolerance:  Tolerated well, no immediate complications      Malvin Johns, MD 08/02/19 1526

## 2019-08-02 NOTE — ED Triage Notes (Signed)
Pt bib [facility] from home w/ c/o 10 ft from ladder. No loc, aox4, neuro intact. No head trauma noted by EMS, pt placed in c-collar by EMS. Obvious deformity to L arm, injury splinted by EMS. Pt c/o 4/10 pain on arrival to ED, pt received 58mcg fentanyl w/ EMS. EMS VSS, 12 lead EKG unremarkable.

## 2021-11-03 DIAGNOSIS — M75102 Unspecified rotator cuff tear or rupture of left shoulder, not specified as traumatic: Secondary | ICD-10-CM | POA: Insufficient documentation

## 2022-01-23 IMAGING — DX DG ELBOW 2V*L*
1 series · 2 of 2 positions shown · non-contrast
Comparison: Radiograph same day

CLINICAL DATA: Dislocated elbow and pain after fall

EXAM:
LEFT ELBOW - 2 VIEW

[Series 1: elbow · 0.14mm/px · 2 of 2 slices shown]
[im 1/2]
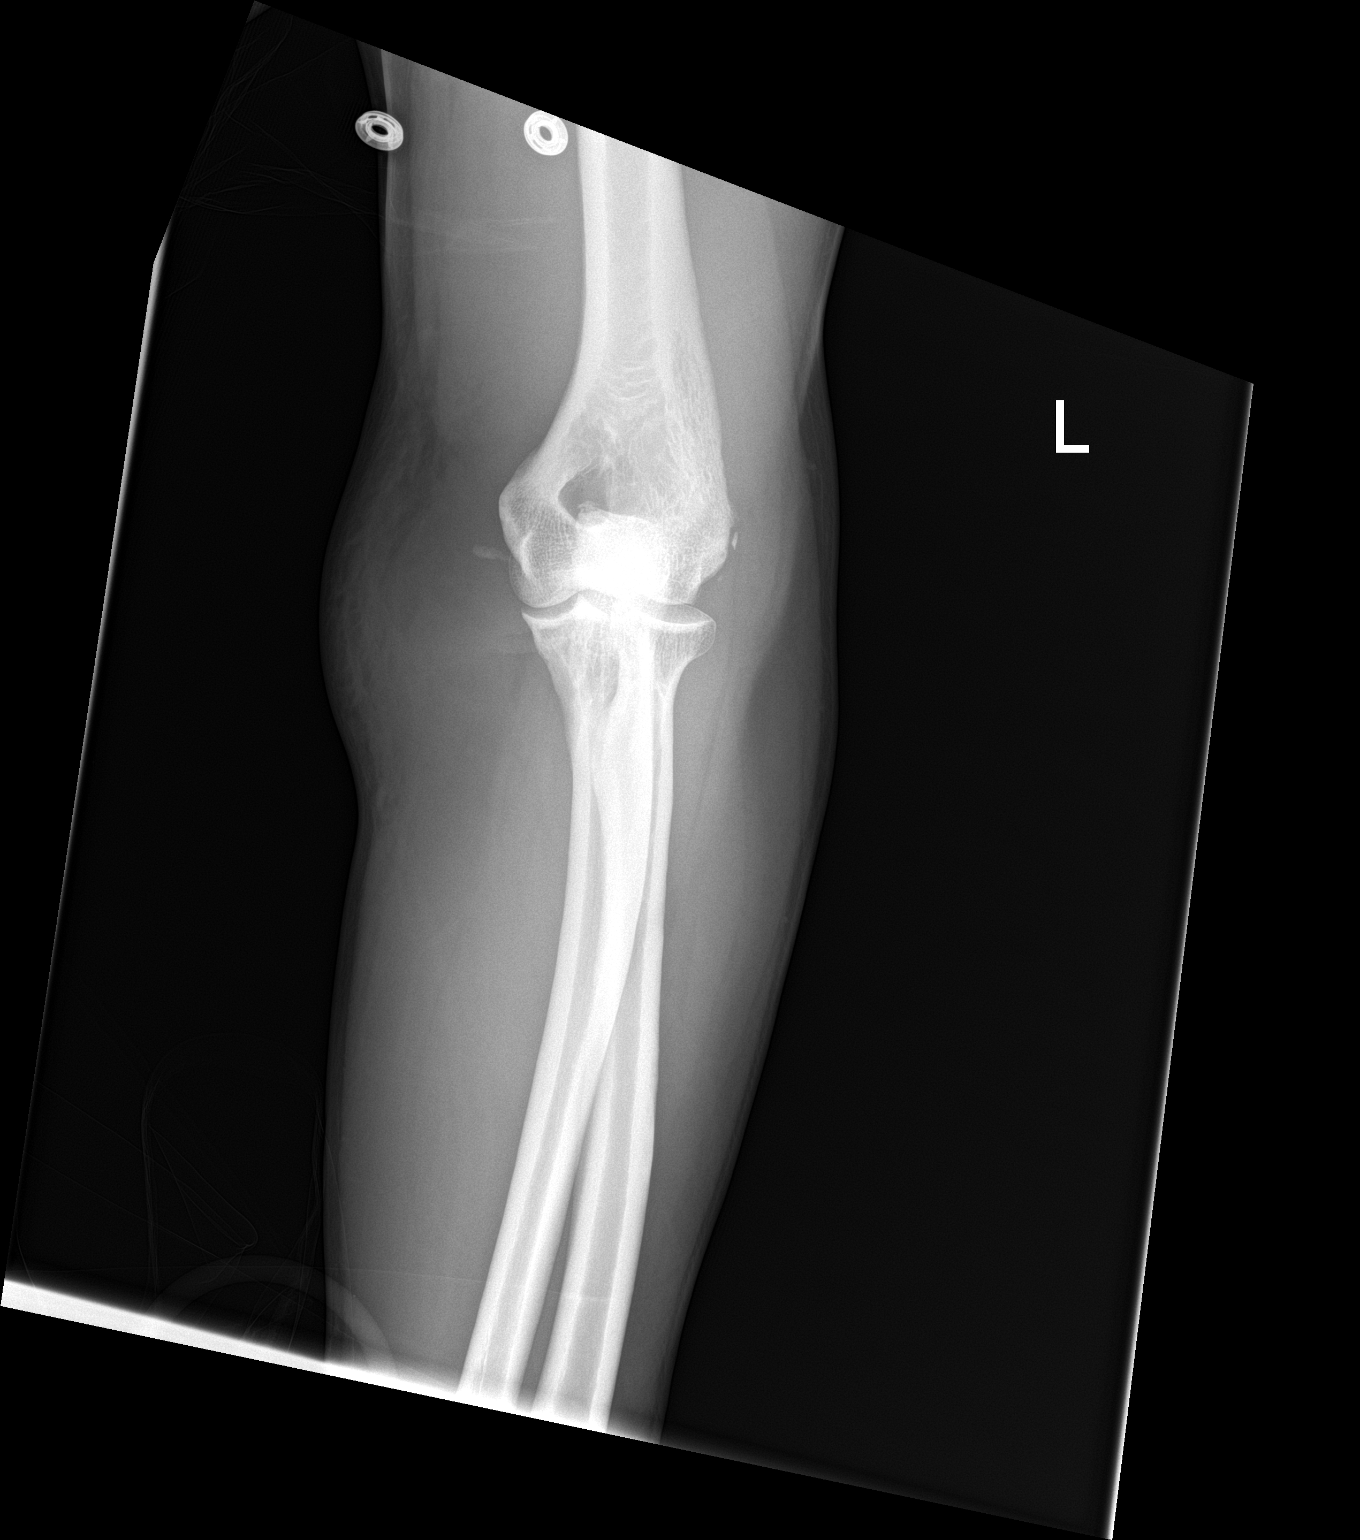
[im 2/2]
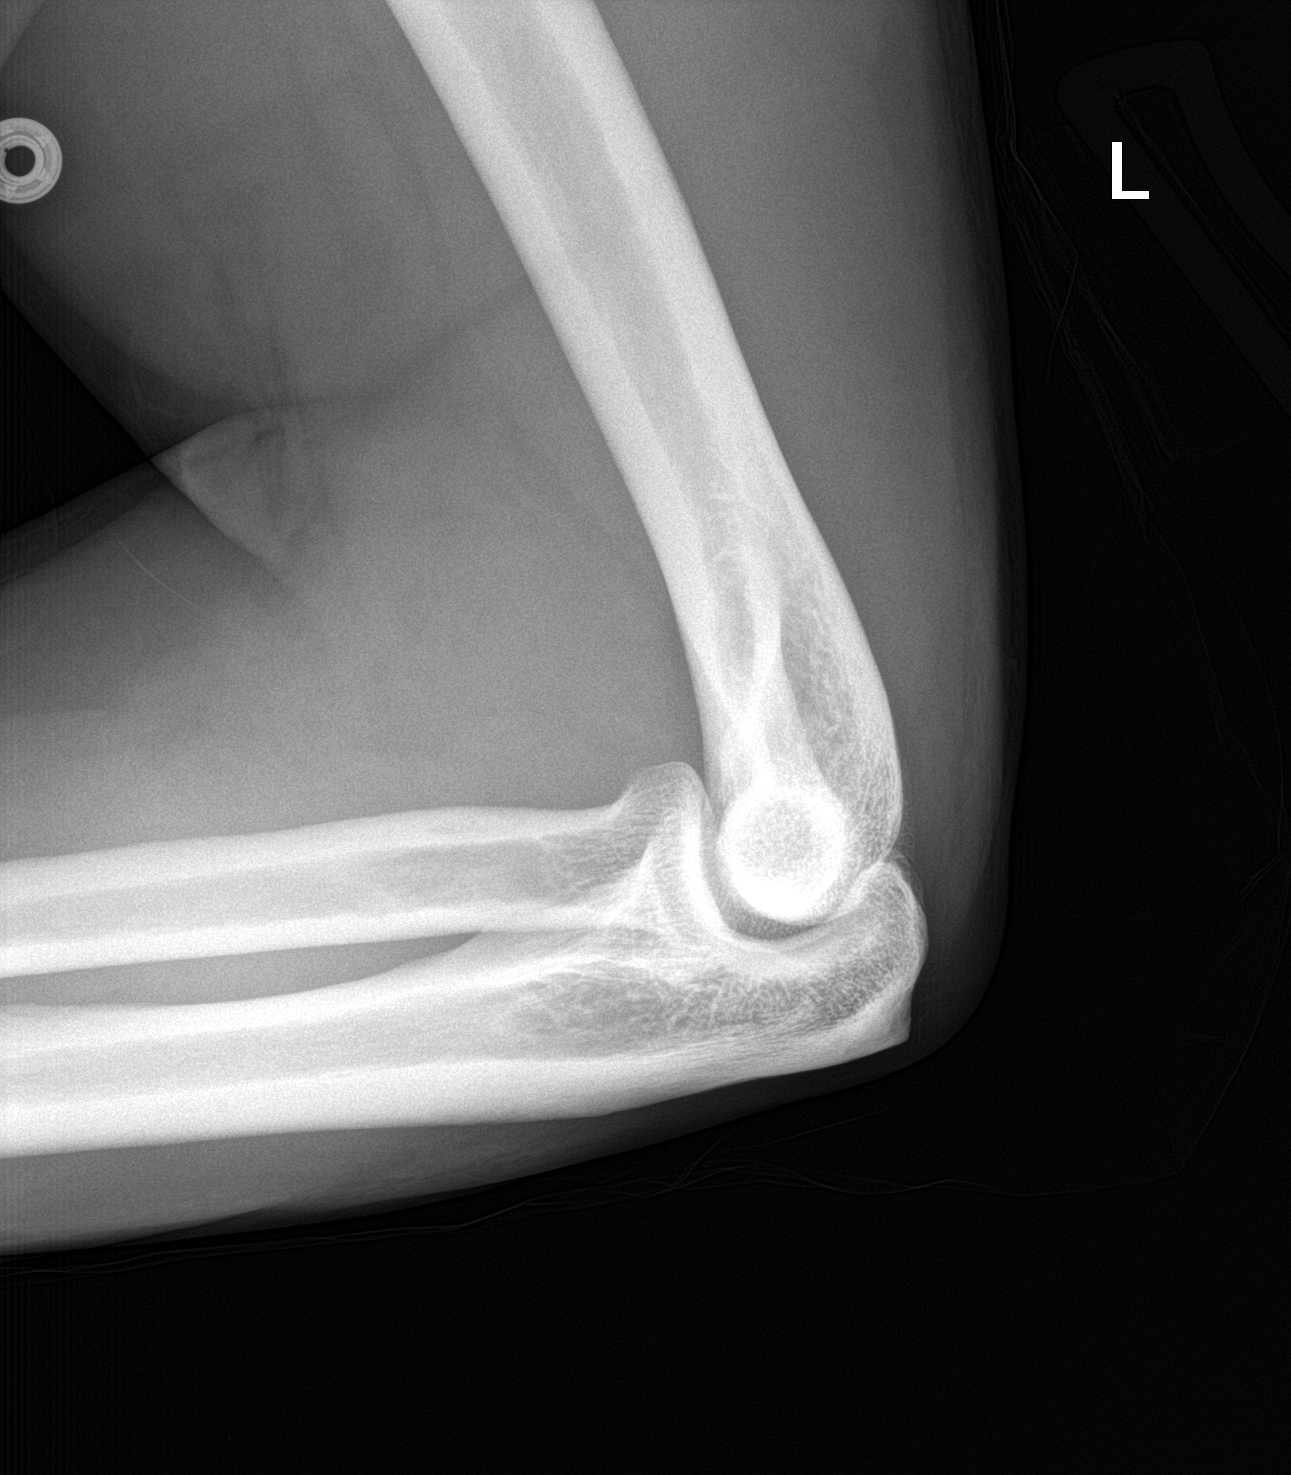

[2 of 2 positions shown; findings below may reference images not displayed]

FINDINGS: There is been interval reduction of the elbow dislocation. Tiny
ossific fragment seen adjacent to the medial and lateral epicondyle,
likely tiny chip fractures. There is diffuse soft tissue swelling
seen over the medial epicondyle.
IMPRESSION: Interval reduction the elbow dislocation. Tiny ossific fragments
likely chip fracture seen adjacent to the medial and lateral
epicondyle.

## 2022-05-24 ENCOUNTER — Encounter: Payer: Self-pay | Admitting: Gastroenterology

## 2022-05-31 ENCOUNTER — Encounter: Payer: Self-pay | Admitting: Gastroenterology

## 2022-06-27 DIAGNOSIS — E349 Endocrine disorder, unspecified: Secondary | ICD-10-CM | POA: Insufficient documentation

## 2022-08-01 ENCOUNTER — Encounter: Payer: Self-pay | Admitting: Gastroenterology

## 2022-08-01 ENCOUNTER — Ambulatory Visit (AMBULATORY_SURGERY_CENTER): Payer: 59 | Admitting: *Deleted

## 2022-08-01 VITALS — Ht 66.0 in | Wt 175.0 lb

## 2022-08-01 DIAGNOSIS — Z85038 Personal history of other malignant neoplasm of large intestine: Secondary | ICD-10-CM

## 2022-08-01 MED ORDER — NA SULFATE-K SULFATE-MG SULF 17.5-3.13-1.6 GM/177ML PO SOLN: 1.0000 | Freq: Once | ORAL | 0 refills | Status: AC

## 2022-08-01 NOTE — Progress Notes (Signed)
Pt's name and DOB verified at the beginning of the pre-visit.  Pt denies any difficulty with ambulating,sitting, laying down or rolling side to side Gave both [facility] main # and MD on call # prior to instructions.  No egg or soy allergy known to patient  No issues known to pt with past sedation with any surgeries or procedures Pt denies having issues being intubated Pt has no issues moving head neck or swallowing No FH of Malignant Hyperthermia Pt is not on diet pills Pt is not on home 02  Pt is not on blood thinners  Pt denies issues with constipation  Pt is not on dialysis Pt denise any abnormal heart rhythms  Pt denies any upcoming cardiac testing Pt encouraged to use to use Singlecare or Goodrx to reduce cost  Patient's chart reviewed by John Nulty CNRA prior to pre-visit and patient appropriate for the [facility].  Pre-visit completed and red dot placed by patient's name on their procedure day (on provider's schedule).  . Visit by phone Pt states weight is 175 lb Instructed pt why it is important to and  to call if they have any changes in health or new medications. Directed them to the # given and on instructions.   Pt states they will.  Instructions reviewed with pt and pt states understanding. Instructed to review again prior to procedure. Pt states they will.  Instructions sent by mail with coupon and by my chart   

## 2022-08-17 ENCOUNTER — Telehealth: Payer: Self-pay | Admitting: Gastroenterology

## 2022-08-17 NOTE — Telephone Encounter (Signed)
Unable to reach pt. LM with call back # . Will attempt tto reach again.

## 2022-08-17 NOTE — Telephone Encounter (Signed)
Spoke with pt and informed him it would need to be 10 days from day diagnosed with COVID to have procedure. Pt stated he was OK with this. RN sent him t schedulers to have it rescheduled.

## 2022-08-17 NOTE — Telephone Encounter (Signed)
Inbound call from patient stating that he is scheduled to have a colonoscopy on 8/7 at 9:30 with Dr. Meridee Score and has tested positive for COVID. Patient stated he went to the doctor and they have given him medications but wanted him to call to see if he can proceed with procedure. Please advise.

## 2022-08-22 ENCOUNTER — Encounter: Payer: 59 | Admitting: Gastroenterology

## 2022-10-02 ENCOUNTER — Ambulatory Visit: Payer: 59 | Admitting: *Deleted

## 2022-10-02 VITALS — Ht 66.0 in | Wt 180.0 lb

## 2022-10-02 DIAGNOSIS — Z86010 Personal history of colonic polyps: Secondary | ICD-10-CM

## 2022-10-02 NOTE — Progress Notes (Signed)
No egg or soy allergy known to patient  No issues known to pt with past sedation with any surgeries or procedures Patient denies ever being told they had issues or difficulty with intubation  No FH of Malignant Hyperthermia Pt is not on diet pills Pt is not on  home 02  Pt is not on blood thinners  Pt denies issues with constipation  No A fib or A flutter Have any cardiac testing pending--no Pt instructed to use Singlecare.com or GoodRx for a price reduction on prep    Pt.has suprep at home from previous appointment.  Patient's chart reviewed by Cathlyn Parsons CNRA prior to previsit and patient appropriate for the [facility].  Previsit completed and red dot placed by patient's name on their procedure day (on provider's schedule).

## 2022-10-05 ENCOUNTER — Encounter: Payer: Self-pay | Admitting: Gastroenterology

## 2022-10-16 ENCOUNTER — Ambulatory Visit: Payer: 59 | Admitting: Gastroenterology

## 2022-10-16 ENCOUNTER — Encounter: Payer: Self-pay | Admitting: Gastroenterology

## 2022-10-16 VITALS — BP 105/66 | HR 92 | Temp 98.0°F | Resp 21 | Ht 66.0 in | Wt 180.0 lb

## 2022-10-16 DIAGNOSIS — Z09 Encounter for follow-up examination after completed treatment for conditions other than malignant neoplasm: Secondary | ICD-10-CM

## 2022-10-16 DIAGNOSIS — D122 Benign neoplasm of ascending colon: Secondary | ICD-10-CM | POA: Diagnosis not present

## 2022-10-16 DIAGNOSIS — D123 Benign neoplasm of transverse colon: Secondary | ICD-10-CM

## 2022-10-16 DIAGNOSIS — Z860101 Personal history of adenomatous and serrated colon polyps: Secondary | ICD-10-CM | POA: Diagnosis not present

## 2022-10-16 DIAGNOSIS — D125 Benign neoplasm of sigmoid colon: Secondary | ICD-10-CM

## 2022-10-16 DIAGNOSIS — Z860100 Personal history of colon polyps, unspecified: Secondary | ICD-10-CM

## 2022-10-16 DIAGNOSIS — Z85038 Personal history of other malignant neoplasm of large intestine: Secondary | ICD-10-CM

## 2022-10-16 MED ORDER — SODIUM CHLORIDE 0.9 % IV SOLN: 500.0000 mL | Freq: Once | INTRAVENOUS | Status: AC

## 2022-10-16 NOTE — Op Note (Signed)
[facility] Patient Name: Cory Baker Procedure Date: 10/16/2022 2:24 PM MRN: [id number] Endoscopist: Corliss Parish , MD, [id number] Age: 67 Referring MD:  Date of Birth: May 07, 1955 Gender: Male Account #: [id number] Procedure:                Colonoscopy Indications:              Surveillance: Personal history of adenomatous                            polyps on last colonoscopy 3 years ago Medicines:                Monitored Anesthesia Care Procedure:                Pre-Anesthesia Assessment:                           - Prior to the procedure, a History and Physical                            was performed, and patient medications and                            allergies were reviewed. The patient's tolerance of                            previous anesthesia was also reviewed. The risks                            and benefits of the procedure and the sedation                            options and risks were discussed with the patient.                            All questions were answered, and informed consent                            was obtained. Prior Anticoagulants: The patient has                            taken no anticoagulant or antiplatelet agents                            except for aspirin. ASA Grade Assessment: II - A                            patient with mild systemic disease. After reviewing                            the risks and benefits, the patient was deemed in                            satisfactory condition to undergo the procedure.  After obtaining informed consent, the colonoscope                            was passed under direct vision. Throughout the                            procedure, the patient's blood pressure, pulse, and                            oxygen saturations were monitored continuously. The                            CF [id number] #[id number] was introduced through the anus                             and advanced to the the cecum, identified by                            appendiceal orifice and ileocecal valve. The                            colonoscopy was performed without difficulty. The                            patient tolerated the procedure. The quality of the                            bowel preparation was adequate. The ileocecal                            valve, appendiceal orifice, and rectum were                            photographed. Scope In: 3:41:00 PM Scope Out: 4:02:27 PM Scope Withdrawal Time: 0 hours 16 minutes 27 seconds  Total Procedure Duration: 0 hours 21 minutes 27 seconds  Findings:                 The digital rectal exam findings include                            hemorrhoids. Pertinent negatives include no                            palpable rectal lesions.                           The colon (entire examined portion) revealed                            significantly excessive looping.                           Four sessile polyps were found in the sigmoid  colon, transverse colon, hepatic flexure and                            ascending colon. The polyps were 3 to 10 mm in                            size. These polyps were removed with a cold snare.                            Resection and retrieval were complete. To prevent                            bleeding after the polypectomy of the sigmoid colon                            polyp that continued to ooze after 3 minutes of                            evaluation, one hemostatic clip was successfully                            placed (MR conditional). There was no bleeding at                            the end of the procedure.                           Normal mucosa was found in the entire colon                            otherwise.                           Anal papilla was hypertrophied.                           Non-bleeding non-thrombosed external and internal                             hemorrhoids were found during retroflexion, during                            perianal exam and during digital exam. The                            hemorrhoids were Grade II (internal hemorrhoids                            that prolapse but reduce spontaneously). Complications:            No immediate complications. Estimated Blood Loss:     Estimated blood loss was minimal. Impression:               - Hemorrhoids found on digital rectal exam.                           -  There was significant looping of the colon.                           - Four 3 to 10 mm polyps in the sigmoid colon, in                            the transverse colon, at the hepatic flexure and in                            the ascending colon, removed with a cold snare.                            Resected and retrieved. Clip (MR conditional) was                            placed.                           - Normal mucosa in the entire examined colon.                           - Anal papilla was hypertrophied.                           - Non-bleeding non-thrombosed external and internal                            hemorrhoids. Recommendation:           - The patient will be observed post-procedure,                            until all discharge criteria are met.                           - Discharge patient to home.                           - Patient has a contact number available for                            emergencies. The signs and symptoms of potential                            delayed complications were discussed with the                            patient. Return to normal activities tomorrow.                            Written discharge instructions were provided to the                            patient.                           -  High fiber diet.                           - Use FiberCon 1-2 tablets PO daily.                           - Continue present medications.                            - Await pathology results.                           - Repeat colonoscopy in 3 years for surveillance.                           - Monitor for signs/symptoms of bleeding,                            perforation, and infection. If issues please call                            our number to get further assistance as needed.                           - The findings and recommendations were discussed                            with the patient.                           - The findings and recommendations were discussed                            with the patient's family. Corliss Parish, MD 10/16/2022 4:09:01 PM

## 2022-10-16 NOTE — Patient Instructions (Signed)
Handouts Provided:  Polyps, Clip Card, High Fiber Diet and Diverticulosis  Use FiberCon 1-2 Tablets by mouth Daily.  YOU HAD AN ENDOSCOPIC PROCEDURE TODAY AT THE [facility]:   Refer to the procedure report that was given to you for any specific questions about what was found during the examination.  If the procedure report does not answer your questions, please call your gastroenterologist to clarify.  If you requested that your care partner not be given the details of your procedure findings, then the procedure report has been included in a sealed envelope for you to review at your convenience later.  YOU SHOULD EXPECT: Some feelings of bloating in the abdomen. Passage of more gas than usual.  Walking can help get rid of the air that was put into your GI tract during the procedure and reduce the bloating. If you had a lower endoscopy (such as a colonoscopy or flexible sigmoidoscopy) you may notice spotting of blood in your stool or on the toilet paper. If you underwent a bowel prep for your procedure, you may not have a normal bowel movement for a few days.  Please Note:  You might notice some irritation and congestion in your nose or some drainage.  This is from the oxygen used during your procedure.  There is no need for concern and it should clear up in a day or so.  SYMPTOMS TO REPORT IMMEDIATELY:  Following lower endoscopy (colonoscopy or flexible sigmoidoscopy):  Excessive amounts of blood in the stool  Significant tenderness or worsening of abdominal pains  Swelling of the abdomen that is new, acute  Fever of 100F or higher  For urgent or emergent issues, a gastroenterologist can be reached at any hour by calling [phone]. Do not use MyChart messaging for urgent concerns.    DIET:  We do recommend a small meal at first, but then you may proceed to your regular diet.  Drink plenty of fluids but you should avoid alcoholic beverages for 24 hours.  ACTIVITY:  You  should plan to take it easy for the rest of today and you should NOT DRIVE or use heavy machinery until tomorrow (because of the sedation medicines used during the test).    FOLLOW UP: Our staff will call the number listed on your records the next business day following your procedure.  We will call around 7:15- 8:00 am to check on you and address any questions or concerns that you may have regarding the information given to you following your procedure. If we do not reach you, we will leave a message.     If any biopsies were taken you will be contacted by phone or by letter within the next 1-3 weeks.  Please call us at [phone] if you have not heard about the biopsies in 3 weeks.    SIGNATURES/CONFIDENTIALITY: You and/or your care partner have signed paperwork which will be entered into your electronic medical record.  These signatures attest to the fact that that the information above on your After Visit Summary has been reviewed and is understood.  Full responsibility of the confidentiality of this discharge information lies with you and/or your care-partner.

## 2022-10-16 NOTE — Progress Notes (Signed)
Called to room to assist during endoscopic procedure.  Patient ID and intended procedure confirmed with present staff. Received instructions for my participation in the procedure from the performing physician.  

## 2022-10-16 NOTE — Progress Notes (Unsigned)
Vitals-SH  Pt's states no medical or surgical changes since previsit or office visit. 

## 2022-10-16 NOTE — Progress Notes (Unsigned)
GASTROENTEROLOGY PROCEDURE H&P NOTE   Primary Care Physician: Eartha Inch, MD  HPI: Cory Baker is a 67 y.o. male who presents for Colonoscopy for surveillance of prior adenomas (and prior advanced adenoma previously).  Past Medical History:  Diagnosis Date   Allergy    mils seasonal   Arthritis    bilateral shoulders   Asthma    Heart murmur    Hyperlipidemia    Hypertension    Internal hemorrhoids    Kidney stones    Sinus congestion    Past Surgical History:  Procedure Laterality Date   COLONOSCOPY     08-2007   ELBOW SURGERY Left    HAND TENDON SURGERY Right    POLYPECTOMY     Current Outpatient Medications  Medication Sig Dispense Refill   acetaminophen (TYLENOL) 500 MG chewable tablet Chew 1,000 mg by mouth every 6 (six) hours as needed for pain.     amLODipine (NORVASC) 10 MG tablet Take 1 tablet by mouth daily.     atorvastatin (LIPITOR) 20 MG tablet Take 20 mg by mouth daily.     Coenzyme Q10 60 MG CAPS Take by mouth.     methylcellulose oral powder Take by mouth daily.     Multiple Vitamin (MULTIVITAMIN) tablet Take 1 tablet by mouth daily.     Testosterone 20.25 MG/ACT (1.62%) GEL Place onto the skin.     Ascorbic Acid (VITAMIN C) 1000 MG tablet Take 1,000 mg by mouth as needed.     aspirin 81 MG chewable tablet Chew by mouth daily. (Patient not taking: Reported on 07/26/2019)     Current Facility-Administered Medications  Medication Dose Route Frequency Provider Last Rate Last Admin   0.9 %  sodium chloride infusion  500 mL Intravenous Once Mansouraty, Netty Starring., MD        Current Outpatient Medications:    acetaminophen (TYLENOL) 500 MG chewable tablet, Chew 1,000 mg by mouth every 6 (six) hours as needed for pain., Disp: , Rfl:    amLODipine (NORVASC) 10 MG tablet, Take 1 tablet by mouth daily., Disp: , Rfl:    atorvastatin (LIPITOR) 20 MG tablet, Take 20 mg by mouth daily., Disp: , Rfl:    Coenzyme Q10 60 MG CAPS, Take by mouth., Disp:  , Rfl:    methylcellulose oral powder, Take by mouth daily., Disp: , Rfl:    Multiple Vitamin (MULTIVITAMIN) tablet, Take 1 tablet by mouth daily., Disp: , Rfl:    Testosterone 20.25 MG/ACT (1.62%) GEL, Place onto the skin., Disp: , Rfl:    Ascorbic Acid (VITAMIN C) 1000 MG tablet, Take 1,000 mg by mouth as needed., Disp: , Rfl:    aspirin 81 MG chewable tablet, Chew by mouth daily. (Patient not taking: Reported on 07/26/2019), Disp: , Rfl:   Current Facility-Administered Medications:    0.9 %  sodium chloride infusion, 500 mL, Intravenous, Once, Mansouraty, Netty Starring., MD No Known Allergies Family History  Problem Relation Age of Onset   Kidney disease Mother    Cancer Mother        breast   Breast cancer Mother    Hypertension Father    Cancer Father 67       Prostate   Cancer Sister    Mental illness Sister    Heart disease Brother    Diabetes Brother    Mental illness Brother    Asthma Son    Colon cancer Neg Hx    Colon polyps Neg Hx  Esophageal cancer Neg Hx    Rectal cancer Neg Hx    Stomach cancer Neg Hx    Crohn's disease Neg Hx    Ulcerative colitis Neg Hx    Social History   Socioeconomic History   Marital status: Married    Spouse name: Gershon Cull   Number of children: 3   Years of education: [employer] education level: Not on file  Occupational History   Occupation: Engineering geologist: [employer]  Tobacco Use   Smoking status: Never   Smokeless tobacco: Never  Vaping Use   Vaping status: Never Used  Substance and Sexual Activity   Alcohol use: No    Alcohol/week: 0.0 standard drinks of alcohol   Drug use: No   Sexual activity: Yes  Other Topics Concern   Not on file  Social History Narrative   Exercise: Yes   Lives with his wife.   Their children are in college, and/or live independently.   Social Determinants of Health   Financial Resource Strain: Low Risk  (04/02/2022)   Received from [facility], [facility]    Overall Financial Resource Strain (CARDIA)    Difficulty of Paying Living Expenses: Not hard at all  Food Insecurity: No Food Insecurity (04/02/2022)   Received from [facility], [facility]   Hunger Vital Sign    Worried About Running Out of Food in the Last Year: Never true    Ran Out of Food in the Last Year: Never true  Transportation Needs: No Transportation Needs (04/02/2022)   Received from [facility], [facility]   PRAPARE - Transportation    Lack of Transportation (Medical): No    Lack of Transportation (Non-Medical): No  Physical Activity: Sufficiently Active (04/02/2022)   Received from [facility], [facility]   Exercise Vital Sign    Days of Exercise per Week: 3 days    Minutes of Exercise per Session: 60 min  Stress: No Stress Concern Present (04/02/2022)   Received from [facility], [facility] - Occupational Stress Questionnaire    Feeling of Stress : Not at all  Social Connections: Moderately Integrated (04/02/2022)   Received from [facility], [facility]   Social Network    How would you rate your social network (family, work, friends)?: Adequate participation with social networks  Intimate Partner Violence: Not At Risk (04/02/2022)   Received from [facility], [facility]   HITS    Over the last 12 months how often did your partner physically hurt you?: 1    Over the last 12 months how often did your partner insult you or talk down to you?: 1    Over the last 12 months how often did your partner threaten you with physical harm?: 1    Over the last 12 months how often did your partner scream or curse at you?: 1    Physical Exam: Today's Vitals   10/16/22 1432  BP: (!) 169/92  Pulse: 77  Temp: 98 F (36.7 C)  TempSrc: Temporal  SpO2: 98%  Weight: 180 lb (81.6 kg)  Height: 5\' 6"  (1.676 m)  PainSc: 0-No pain   Body mass index is 29.05 kg/m. GEN: NAD EYE: Sclerae anicteric ENT: MMM CV:  Non-tachycardic GI: Soft, NT/ND NEURO:  Alert & Oriented x 3  Lab Results: No results for input(s): "WBC", "HGB", "HCT", "PLT" in the last 72 hours. BMET No results for input(s): "NA", "K", "  CL", "CO2", "GLUCOSE", "BUN", "CREATININE", "CALCIUM" in the last 72 hours. LFT No results for input(s): "PROT", "ALBUMIN", "AST", "ALT", "ALKPHOS", "BILITOT", "BILIDIR", "IBILI" in the last 72 hours. PT/INR No results for input(s): "LABPROT", "INR" in the last 72 hours.   Impression / Plan: This is a 67 y.o.male who presents for Colonoscopy for surveillance of prior adenomas (and prior advanced adenoma previously).   The risks and benefits of endoscopic evaluation/treatment were discussed with the patient and/or family; these include but are not limited to the risk of perforation, infection, bleeding, missed lesions, lack of diagnosis, severe illness requiring hospitalization, as well as anesthesia and sedation related illnesses.  The patient's history has been reviewed, patient examined, no change in status, and deemed stable for procedure.  The patient and/or family is agreeable to proceed.    Corliss Parish, MD [facility] Advanced Endoscopy Office # [phone]

## 2022-10-16 NOTE — Progress Notes (Unsigned)
Uneventful anesthetic. Report to pacu rn. Vss. Care resumed by rn. 

## 2022-10-17 ENCOUNTER — Telehealth: Payer: Self-pay | Admitting: *Deleted

## 2022-10-17 NOTE — Telephone Encounter (Signed)
  Follow up Call-     10/16/2022    2:35 PM 10/16/2022    2:32 PM  Call back number  Post procedure Call Back phone  # [phone]   Permission to leave phone message  Yes     Patient questions:   Message left to call us if necessary.

## 2022-10-19 ENCOUNTER — Encounter: Payer: Self-pay | Admitting: Gastroenterology

## 2022-10-19 LAB — SURGICAL PATHOLOGY

## 2024-02-08 ENCOUNTER — Ambulatory Visit (HOSPITAL_COMMUNITY)

## 2024-02-08 ENCOUNTER — Encounter (HOSPITAL_COMMUNITY): Payer: Self-pay

## 2024-02-08 ENCOUNTER — Ambulatory Visit (HOSPITAL_COMMUNITY)
Admission: EM | Admit: 2024-02-08 | Discharge: 2024-02-08 | Disposition: A | Discharge status: Home / Self Care | Attending: Emergency Medicine | Admitting: Emergency Medicine

## 2024-02-08 DIAGNOSIS — M19041 Primary osteoarthritis, right hand: Secondary | ICD-10-CM | POA: Diagnosis not present

## 2024-02-08 DIAGNOSIS — S6991XA Unspecified injury of right wrist, hand and finger(s), initial encounter: Secondary | ICD-10-CM

## 2024-02-08 DIAGNOSIS — M79644 Pain in right finger(s): Secondary | ICD-10-CM

## 2024-02-08 MED ORDER — PREDNISONE 20 MG PO TABS: 40.0000 mg | ORAL_TABLET | Freq: Every day | ORAL | 0 refills | Status: AC

## 2024-02-08 NOTE — Discharge Instructions (Signed)
 Your x-ray revealed a questionable fracture of your thumb, but also revealed significant osteoarthritis which could be causing your pain and swelling. Start taking 2 tablets of prednisone  once daily for 5 days to help with this. We provided you with a splint to help stabilize the joint of your thumb. Rest, ice, and elevate periodically throughout the day to help with swelling and pain. Follow-up with Dr. Shari who is a hand specialist for further evaluation and management of this.

## 2024-02-08 NOTE — ED Triage Notes (Signed)
 Patient states he hurt his right thumb while working on a car 3-4 days ago. Patient has swelling and pain to the right thumb.  Patient states he has been putting antibiotic cream on his thumb.

## 2024-02-08 NOTE — ED Provider Notes (Signed)
 " [facility]    CSN: [id number] Arrival date & time: 02/08/24  0844      History   Chief Complaint Chief Complaint  Patient presents with   thumb injury    HPI Cory Baker is a 69 y.o. male.   Patient presents with right thumb pain and swelling that he noticed on 1/20.  Patient reports that he was working on his car on 1/19 and is unsure if he injured his thumb while doing this, but states that he may have hit it on something.  Patient reports that he has been able to move his thumb, does have some discomfort with this.  Patient reports he is applying antibiotic cream to his thumb.  Patient does report history of surgery to a tendon in his right hand years back.  Patient reports since that surgery he has never felt like his thumb has looked as straight as his left thumb.  The history is provided by the patient and medical records.    Past Medical History:  Diagnosis Date   Allergy    mils seasonal   Arthritis    bilateral shoulders   Asthma    Heart murmur    Hyperlipidemia    Hypertension    Internal hemorrhoids    Kidney stones    Sinus congestion     Patient Active Problem List   Diagnosis Date Noted   Testosterone deficiency 06/27/2022   Rotator cuff tear arthropathy, left 11/03/2021   PFO (patent foramen ovale) 11/13/2016   Essential hypertension 01/13/2016   Mallet finger of right hand 12/07/2014   Fecal occult blood test positive 11/25/2013   Hyperlipidemia with target low density lipoprotein (LDL) cholesterol less than 100 mg/dL 88/80/7986   Family hx of prostate cancer 12/04/2011   DDD (degenerative disc disease), cervical 12/04/2011   Overweight 12/04/2011    Past Surgical History:  Procedure Laterality Date   COLONOSCOPY     08-2007   ELBOW SURGERY Left    HAND TENDON SURGERY Right    POLYPECTOMY         Home Medications    Prior to Admission medications  Medication Sig Start Date End Date Taking? Authorizing Provider   predniSONE  (DELTASONE ) 20 MG tablet Take 2 tablets (40 mg total) by mouth daily for 5 days. 02/08/24 02/13/24 Yes Johnie Flaming A, NP  acetaminophen  (TYLENOL ) 500 MG chewable tablet Chew 1,000 mg by mouth every 6 (six) hours as needed for pain.    [provider]  amLODipine (NORVASC) 10 MG tablet Take 1 tablet by mouth daily. 09/03/22   [provider]  Ascorbic Acid (VITAMIN C) 1000 MG tablet Take 1,000 mg by mouth as needed.    [provider]  atorvastatin (LIPITOR) 20 MG tablet Take 20 mg by mouth daily. 03/30/19   [provider]  Coenzyme Q10 60 MG CAPS Take by mouth.    [provider]  methylcellulose oral powder Take by mouth daily.    [provider]  Multiple Vitamin (MULTIVITAMIN) tablet Take 1 tablet by mouth daily.    [provider]  Testosterone 20.25 MG/ACT (1.62%) GEL Place onto the skin. 07/06/22 07/06/23  [provider]    Family History Family History  Problem Relation Age of Onset   Kidney disease Mother    Cancer Mother        breast   Breast cancer Mother    Hypertension Father    Cancer Father 13  Prostate   Cancer Sister    Mental illness Sister    Heart disease Brother    Diabetes Brother    Mental illness Brother    Asthma Son    Colon cancer Neg Hx    Colon polyps Neg Hx    Esophageal cancer Neg Hx    Rectal cancer Neg Hx    Stomach cancer Neg Hx    Crohn's disease Neg Hx    Ulcerative colitis Neg Hx     Social History Social History[1]   Allergies   Patient has no known allergies.   Review of Systems Review of Systems  Per HPI  Physical Exam Triage Vital Signs ED Triage Vitals [02/08/24 0935]  Encounter Vitals Group     BP 126/69     Girls Systolic BP Percentile      Girls Diastolic BP Percentile      Boys Systolic BP Percentile      Boys Diastolic BP Percentile      Pulse Rate 75     Resp 16     Temp 98.3 F (36.8 C)     Temp Source Oral     SpO2  96 %     Weight      Height      Head Circumference      Peak Flow      Pain Score 3     Pain Loc      Pain Education      Exclude from Growth Chart    No data found.  Updated Vital Signs BP 126/69 (BP Location: Left Arm)   Pulse 75   Temp 98.3 F (36.8 C) (Oral)   Resp 16   SpO2 96%   Visual Acuity Right Eye Distance:   Left Eye Distance:   Bilateral Distance:    Right Eye Near:   Left Eye Near:    Bilateral Near:     Physical Exam Vitals and nursing note reviewed.  Constitutional:      General: He is awake. He is not in acute distress.    Appearance: Normal appearance. He is well-developed and well-groomed. He is not ill-appearing.  Musculoskeletal:     Right hand: Swelling, deformity and tenderness present. Normal range of motion. Normal strength. Normal sensation. There is no disruption of two-point discrimination. Normal capillary refill. Normal pulse.     Comments: Swelling and tenderness noted over DIP joint of right thumb with slight deformity present.  Skin:    General: Skin is warm and dry.  Neurological:     General: No focal deficit present.     Mental Status: He is alert and oriented to person, place, and time. Mental status is at baseline.  Psychiatric:        Behavior: Behavior is cooperative.      UC Treatments / Results  Labs (all labs ordered are listed, but only abnormal results are displayed) Labs Reviewed - No data to display  EKG   Radiology DG Finger Thumb Right Result Date: 02/08/2024 EXAM: 3 VIEW(S) XRAY OF THE RIGHT THUMB 02/08/2024 10:22:36 AM COMPARISON: None available. CLINICAL HISTORY: Right thumb injury. FINDINGS: BONES AND JOINTS: No acute fracture identified. No malalignment. 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 Moderate degenerative loss of articular space at the 1st carpometacarpal articulation.  Questionable fragmented component of spurring along the volar base of the distal phalanx. SOFT TISSUES: There is  soft tissue swelling in the thumb especially around the interphalangeal joint, and also mild soft tissue swelling adjacent to the 1st carpometacarpal articulation. IMPRESSION: 1. Soft tissue swelling of the thumb, greatest at the interphalangeal joint, with mild adjacent swelling at the 1st carpometacarpal articulation. 2. 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 3. 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 4. Questionable fragmented osteophyte at the volar base of the distal phalanx. Electronically signed by: Ryan Salvage MD 02/08/2024 11:26 AM EST RP Workstation: [id number]    Procedures Procedures (including critical care time)  Medications Ordered in UC Medications - No data to display  Initial Impression / Assessment and Plan / UC Course  I have reviewed the triage vital signs and the nursing notes.  Pertinent labs & imaging results that were available during my care of the patient were reviewed by me and considered in my medical decision making (see chart for details).     Patient is overall well-appearing.  Vitals are stable.  X-ray ordered.  Radiology report reveals soft tissue swelling of the thumb, greatest at the interphalangeal joint, with mild adjacent swelling at the first carpometacarpal articulation.  Also reveals severe osteoarthritis at the thumb possible erosive component, as well as moderate arthritis at the first carpometacarpal articulation.  There is also a questionable fragmented osteophyte at the volar base of the distal phalanx.  I have independently interpreted these images and agree with these findings.  Provided patient with splint to help stabilize the thumb.  Prescribed short course of prednisone  to help with swelling and pain related to osteoarthritis.  Discussed RICE therapy.   Given hand specialist to follow-up with.  Discussed with follow-up and return precautions. Final Clinical Impressions(s) / UC Diagnoses   Final diagnoses:  Pain of right thumb  Injury of right thumb, initial encounter  Osteoarthritis of right hand, unspecified osteoarthritis type     Discharge Instructions      Your x-ray revealed a questionable fracture of your thumb, but also revealed significant osteoarthritis which could be causing your pain and swelling. Start taking 2 tablets of prednisone  once daily for 5 days to help with this. We provided you with a splint to help stabilize the joint of your thumb. Rest, ice, and elevate periodically throughout the day to help with swelling and pain. Follow-up with Dr. Shari who is a hand specialist for further evaluation and management of this.     ED Prescriptions     Medication Sig Dispense Auth. Provider   predniSONE  (DELTASONE ) 20 MG tablet Take 2 tablets (40 mg total) by mouth daily for 5 days. 10 tablet Johnie Flaming A, NP      PDMP not reviewed this encounter.    [1]  Social History Tobacco Use   Smoking status: Never   Smokeless tobacco: Never  Vaping Use   Vaping status: Never Used  Substance Use Topics   Alcohol use: No    Alcohol/week: 0.0 standard drinks of alcohol   Drug use: No     Johnie Flaming LABOR, NP 02/08/24 1135  "
